# Patient Record
Sex: Male | Born: 1945 | Race: White | Hispanic: No | Marital: Married | Smoking: Never smoker
Health system: Southern US, Community
[De-identification: ages and names within clinical notes are randomized; demographics above are authoritative.]

## PROBLEM LIST (undated history)

## (undated) DIAGNOSIS — N419 Inflammatory disease of prostate, unspecified: Secondary | ICD-10-CM

## (undated) DIAGNOSIS — M1990 Unspecified osteoarthritis, unspecified site: Secondary | ICD-10-CM

## (undated) DIAGNOSIS — E041 Nontoxic single thyroid nodule: Secondary | ICD-10-CM

## (undated) DIAGNOSIS — J9801 Acute bronchospasm: Secondary | ICD-10-CM

## (undated) DIAGNOSIS — Z973 Presence of spectacles and contact lenses: Secondary | ICD-10-CM

## (undated) DIAGNOSIS — Z87442 Personal history of urinary calculi: Secondary | ICD-10-CM

## (undated) DIAGNOSIS — C801 Malignant (primary) neoplasm, unspecified: Secondary | ICD-10-CM

## (undated) DIAGNOSIS — R0600 Dyspnea, unspecified: Secondary | ICD-10-CM

## (undated) DIAGNOSIS — N400 Enlarged prostate without lower urinary tract symptoms: Secondary | ICD-10-CM

## (undated) DIAGNOSIS — K219 Gastro-esophageal reflux disease without esophagitis: Secondary | ICD-10-CM

## (undated) DIAGNOSIS — G500 Trigeminal neuralgia: Secondary | ICD-10-CM

## (undated) DIAGNOSIS — I10 Essential (primary) hypertension: Secondary | ICD-10-CM

## (undated) HISTORY — PX: APPENDECTOMY: SHX54

## (undated) HISTORY — PX: TONSILLECTOMY: SUR1361

## (undated) HISTORY — PX: COLONOSCOPY: SHX174

---

## 1996-10-18 HISTORY — PX: SHOULDER ARTHROSCOPY: SHX128

## 2012-05-18 HISTORY — PX: ABDOMINAL SURGERY: SHX537

## 2012-08-30 ENCOUNTER — Emergency Department (HOSPITAL_COMMUNITY)
Admission: EM | Admit: 2012-08-30 | Discharge: 2012-08-30 | Disposition: A | Discharge status: Home / Self Care | Payer: Medicare Other | Attending: Emergency Medicine | Admitting: Emergency Medicine

## 2012-08-30 ENCOUNTER — Emergency Department (HOSPITAL_COMMUNITY): Payer: Medicare Other

## 2012-08-30 ENCOUNTER — Encounter (HOSPITAL_COMMUNITY): Payer: Self-pay | Admitting: *Deleted

## 2012-08-30 DIAGNOSIS — J9801 Acute bronchospasm: Secondary | ICD-10-CM

## 2012-08-30 DIAGNOSIS — Z87448 Personal history of other diseases of urinary system: Secondary | ICD-10-CM | POA: Insufficient documentation

## 2012-08-30 DIAGNOSIS — I10 Essential (primary) hypertension: Secondary | ICD-10-CM | POA: Insufficient documentation

## 2012-08-30 DIAGNOSIS — Z8719 Personal history of other diseases of the digestive system: Secondary | ICD-10-CM | POA: Insufficient documentation

## 2012-08-30 HISTORY — DX: Inflammatory disease of prostate, unspecified: N41.9

## 2012-08-30 MED ORDER — ALBUTEROL SULFATE HFA 108 (90 BASE) MCG/ACT IN AERS
2.0000 | INHALATION_SPRAY | RESPIRATORY_TRACT | Status: DC | PRN
  Administered 2012-08-30: 2 via RESPIRATORY_TRACT
  Filled 2012-08-30: qty 6.7

## 2012-08-30 MED ORDER — ALBUTEROL SULFATE (5 MG/ML) 0.5% IN NEBU
5.0000 mg | INHALATION_SOLUTION | Freq: Once | RESPIRATORY_TRACT | Status: AC
  Administered 2012-08-30: 5 mg via RESPIRATORY_TRACT
  Filled 2012-08-30: qty 0.5

## 2012-08-30 NOTE — ED Notes (Signed)
Nasal congestion , cough, sob,Non productive cough,  Alert,

## 2012-08-30 NOTE — ED Notes (Signed)
Pt states relief from breathing treatment.

## 2012-08-30 NOTE — ED Provider Notes (Signed)
History   This chart was scribed for Lyanne Co, MD by Sofie Rower, ED Scribe. The patient was seen in room APA01/APA01 and the patient's care was started at 9:59PM.     CSN: [id number]  Arrival date & time 08/30/12  2145   First MD Initiated Contact with Patient 08/30/12 2159      Chief Complaint  Patient presents with  . Shortness of Breath    (Consider location/radiation/quality/duration/timing/severity/associated sxs/prior treatment) The history is provided by the patient and the spouse.    Mark Warren is a 66 y.o. male with a hx of 3 small intestine tumors and hypertension, who presents to the Emergency Department complaining of sudden, progressively worsening, shortness of breath, onset today (08/30/12).  Associated symptoms include non productive cough and nasal congestion. The pt's wife reports the pt began to experience a cough earlier this evening, after returning home from bible study, where his skin became pale. The pt has taken nasal spray which provides moderate relief of the nasal congestion. The pt has a hx of prostatitis and abdominal surgery.   The pt denies fever, hx of heart problems, and hx of blood blots within the lower extremities.   The pt does not smoke or drink alcohol.      Past Medical History  Diagnosis Date  . Prostatitis     Past Surgical History  Procedure Date  . Abdominal surgery     History reviewed. No pertinent family history.  History  Substance Use Topics  . Smoking status: Never Smoker   . Smokeless tobacco: Not on file  . Alcohol Use: No      Review of Systems  All other systems reviewed and are negative.    Allergies  Review of patient's allergies indicates no known allergies.  Home Medications  No current outpatient prescriptions on file.  BP 164/90  Pulse 63  Temp 98.8 F (37.1 C) (Oral)  Resp 20  Ht 5\' 10"  (1.778 m)  Wt 215 lb (97.523 kg)  BMI 30.85 kg/m2  SpO2 96%  Physical Exam  Nursing note  and vitals reviewed. Constitutional: He is oriented to person, place, and time. He appears well-developed and well-nourished.  HENT:  Head: Normocephalic and atraumatic.  Eyes: EOM are normal.  Neck: Normal range of motion.  Cardiovascular: Normal rate, regular rhythm, normal heart sounds and intact distal pulses.   Pulmonary/Chest: Effort normal and breath sounds normal. No respiratory distress.       Lungs are clear bilaterally.   Abdominal: Soft. He exhibits no distension. There is no tenderness.  Musculoskeletal: Normal range of motion.  Neurological: He is alert and oriented to person, place, and time.  Skin: Skin is warm and dry.  Psychiatric: He has a normal mood and affect. Judgment normal.    ED Course  Procedures (including critical care time)  DIAGNOSTIC STUDIES: Oxygen Saturation is 96% on room air, normal by my interpretation.    COORDINATION OF CARE:  10:08 PM- EKG results discussed.    10:11 PM- Treatment plan concerning application of albuterol breathing treatment and x-ray of chest discussed with patient. Pt agrees with treatment.      Dg Chest 2 View  08/30/2012  *RADIOLOGY REPORT*  Clinical Data: Shortness of breath, cough.  CHEST - 2 VIEW  Comparison: None  Findings: Elevation of the right hemidiaphragm. Heart and mediastinal contours are within normal limits.  No focal opacities or effusions.  No acute bony abnormality.  IMPRESSION: No active cardiopulmonary disease.  Original Report Authenticated By: Charlett Nose, M.D.    I personally reviewed the imaging tests through PACS system I reviewed available ER/hospitalization records through the EMR    1. Bronchospasm       MDM  The patient appears to have bronchospasm.  His lungs are now clear.  His vital signs are normal.  Pulse ox is 96% on room air.  Chest x-ray is clear.  The patient feels much better after his albuterol treatment.  Home with an albuterol inhaler.  He understands return to the ER  for new or worsening symptoms.  I doubt this is a pulmonary embolism.  This is not a presentation of ACS.  He has no risk factors for either pulmonary embolism or cardiac disease.      I personally performed the services described in this documentation, which was scribed in my presence. The recorded information has been reviewed and is accurate.      Lyanne Co, MD 08/30/12 [phone]

## 2012-08-30 NOTE — ED Notes (Signed)
resp contacted regarding breathing treatment.

## 2012-12-21 ENCOUNTER — Other Ambulatory Visit: Payer: Self-pay | Admitting: Orthopaedic Surgery

## 2012-12-22 ENCOUNTER — Encounter (HOSPITAL_BASED_OUTPATIENT_CLINIC_OR_DEPARTMENT_OTHER): Payer: Self-pay | Admitting: *Deleted

## 2012-12-22 NOTE — Progress Notes (Signed)
Pt had one episode coughand bronchospasm-11/13-has not had to use inhaler since-will bring-never smoked Will bring meds ans back up overnight bag since he lives far away-will plan to go home post op

## 2012-12-22 NOTE — H&P (Signed)
Mark Warren is an 67 y.o. male.   Chief Complaint: Right shoulder pain. 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  He has had a previous right shoulder arthroscopy many years ago but is now having pain over the last 3-4 months.  It started when he was throwing some trash away and is gotten much worse after his wife startled him in the house.  He is having pain with motion and difficulty sleeping.  X-rays of the shoulder show a 3A acromion and an MRI scan shows supraspinatus and infraspinatus tears with retraction of approximately 3 cm.  We have discussed with him proceeding with a right shoulder arthroscopy to reduce symptoms and hopefully increase function.  Past Medical History  Diagnosis Date  . Prostatitis     Past Surgical History  Procedure Laterality Date  . Abdominal surgery      No family history on file. Social History:  reports that he has never smoked. He does not have any smokeless tobacco history on file. He reports that he does not drink alcohol or use illicit drugs.  Allergies: No Known Allergies  No prescriptions prior to admission    No results found for this or any previous visit (from the past 48 hour(s)). No results found.  Review of Systems  Constitutional: Negative.   HENT: Negative.   Eyes: Negative.   Respiratory: Negative.   Cardiovascular: Negative.   Gastrointestinal: Negative.   Genitourinary: Negative.   Musculoskeletal: Positive for joint pain.  Skin: Negative.   Neurological: Negative.   Endo/Heme/Allergies: Negative.   Psychiatric/Behavioral: Negative.     There were no vitals taken for this visit. Physical Exam  Constitutional: He is oriented to person, place, and time. He appears well-nourished.  HENT:  Head: Atraumatic.  Eyes: Pupils are equal, round, and reactive to light.  Neck: Normal range of motion.  Cardiovascular: Normal rate and regular rhythm.   Respiratory: Breath sounds normal.  GI: Bowel  sounds are normal.  Musculoskeletal:  Right shoulder exam: Range of motion forward flexion 150, external rotation 45 and internal rotation to the sacrum.  He does have positive impingement in both positions.  He does have a painful arc when raising his arm up and down but a negative drop arm test.  Rotator cuff strength 4 minus over 5.  No pain at the a.c. Joint.  Full cervical motion and pain-free.  Neurological: He is oriented to person, place, and time.  Skin: Skin is dry.  Psychiatric: He has a normal mood and affect.     Assessment/Plan Assessment: Right shoulder rotator cuff tear with history of arthroscopy years ago. Plan: [facility] as increasing right shoulder pain causing him trouble sleeping at nighttime.  He is undergone physical therapy which has not been helpful.  We have reviewed the risk of anesthesia infection associated with arthroscopy of the shoulder.  The hope is that we can repair the rotator cuff tear and if this is not possible we would debride it.  Postoperatively he most likely will need physical therapy as well.  CARNAGHI,MICHAEL R 12/22/2012, 8:50 AM

## 2012-12-26 ENCOUNTER — Ambulatory Visit (HOSPITAL_BASED_OUTPATIENT_CLINIC_OR_DEPARTMENT_OTHER): Admission: RE | Admit: 2012-12-26 | Payer: Medicare Other | Source: Ambulatory Visit | Admitting: Orthopaedic Surgery

## 2012-12-26 HISTORY — DX: Essential (primary) hypertension: I10

## 2012-12-26 HISTORY — DX: Gastro-esophageal reflux disease without esophagitis: K21.9

## 2012-12-26 HISTORY — DX: Trigeminal neuralgia: G50.0

## 2012-12-26 HISTORY — DX: Malignant (primary) neoplasm, unspecified: C80.1

## 2012-12-26 HISTORY — DX: Acute bronchospasm: J98.01

## 2012-12-26 HISTORY — DX: Benign prostatic hyperplasia without lower urinary tract symptoms: N40.0

## 2012-12-26 HISTORY — DX: Unspecified osteoarthritis, unspecified site: M19.90

## 2012-12-26 HISTORY — DX: Presence of spectacles and contact lenses: Z97.3

## 2012-12-26 SURGERY — ARTHROSCOPY, SHOULDER, WITH ROTATOR CUFF REPAIR
Anesthesia: General | Site: Shoulder | Laterality: Right

## 2013-04-18 DIAGNOSIS — M25519 Pain in unspecified shoulder: Secondary | ICD-10-CM

## 2013-10-27 IMAGING — CR DG CHEST 2V
2 series · 2 of 2 positions shown · non-contrast
Comparison: None

CLINICAL DATA: Shortness of breath, cough.

CHEST - 2 VIEW

[view not recorded (1 of 2)]
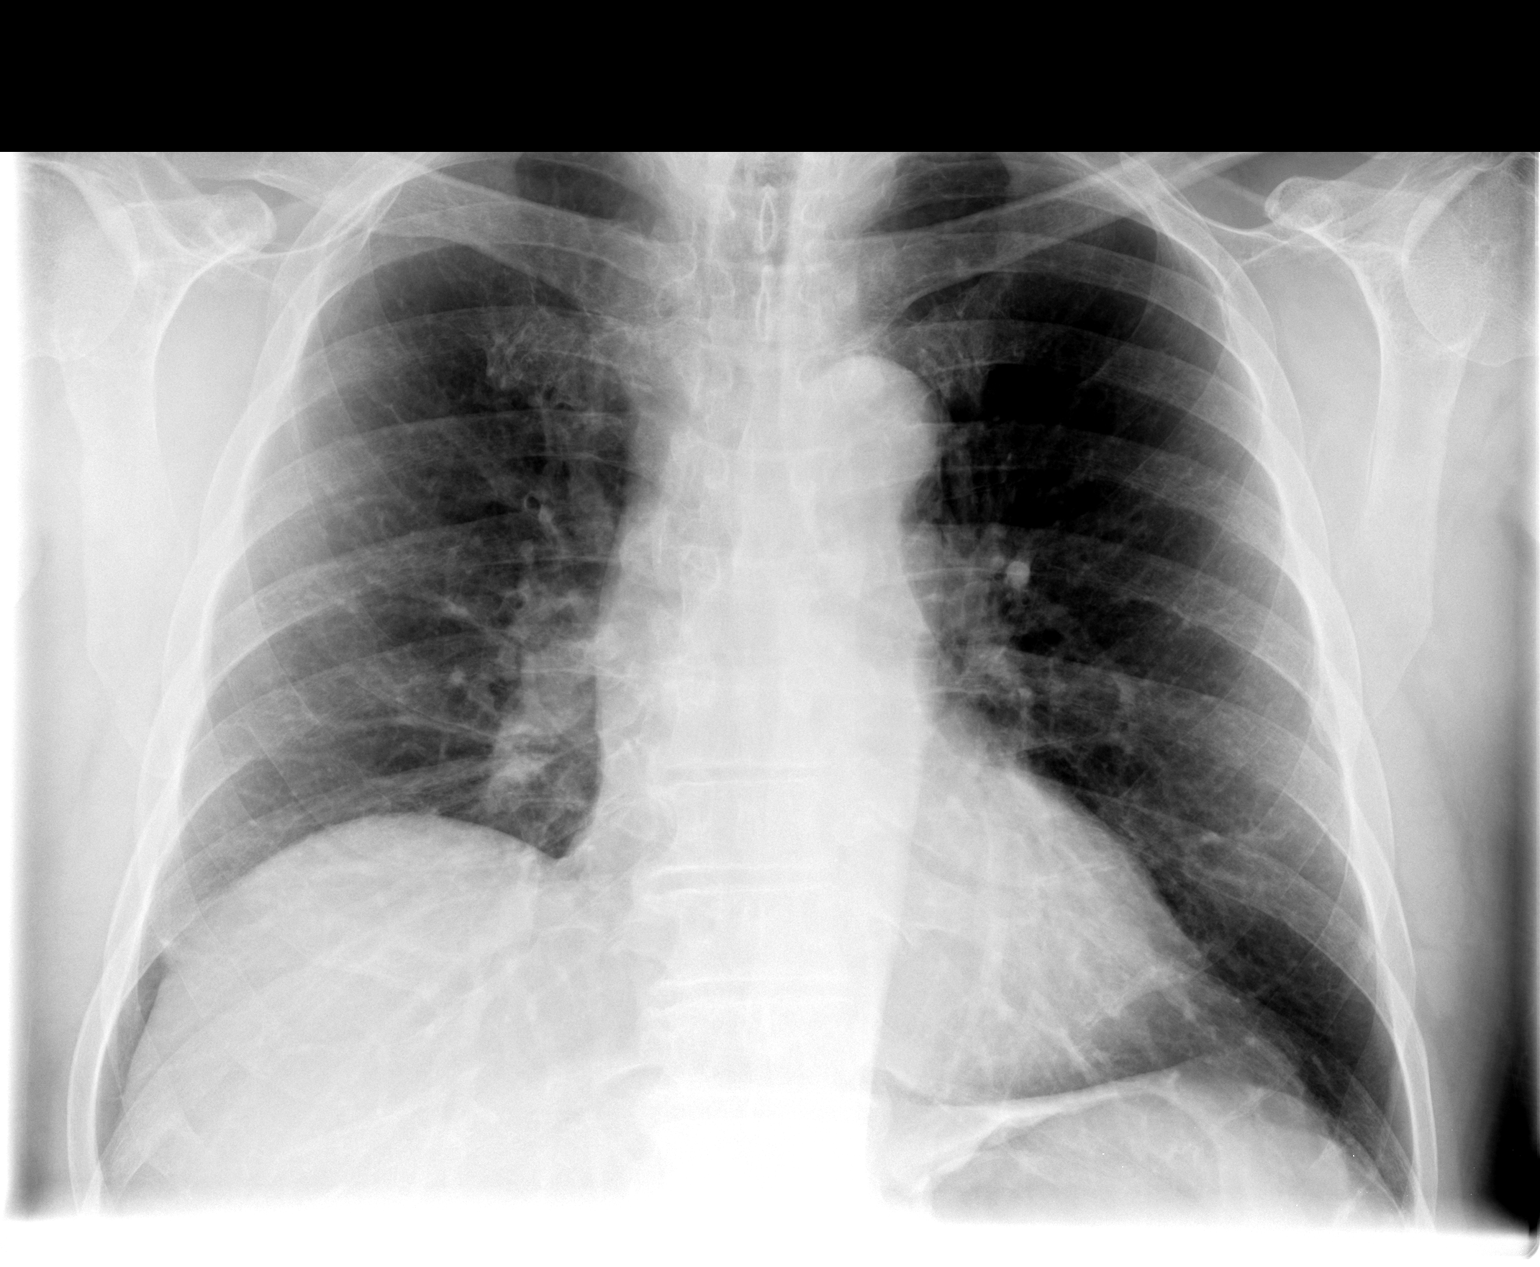

[view not recorded (2 of 2)]
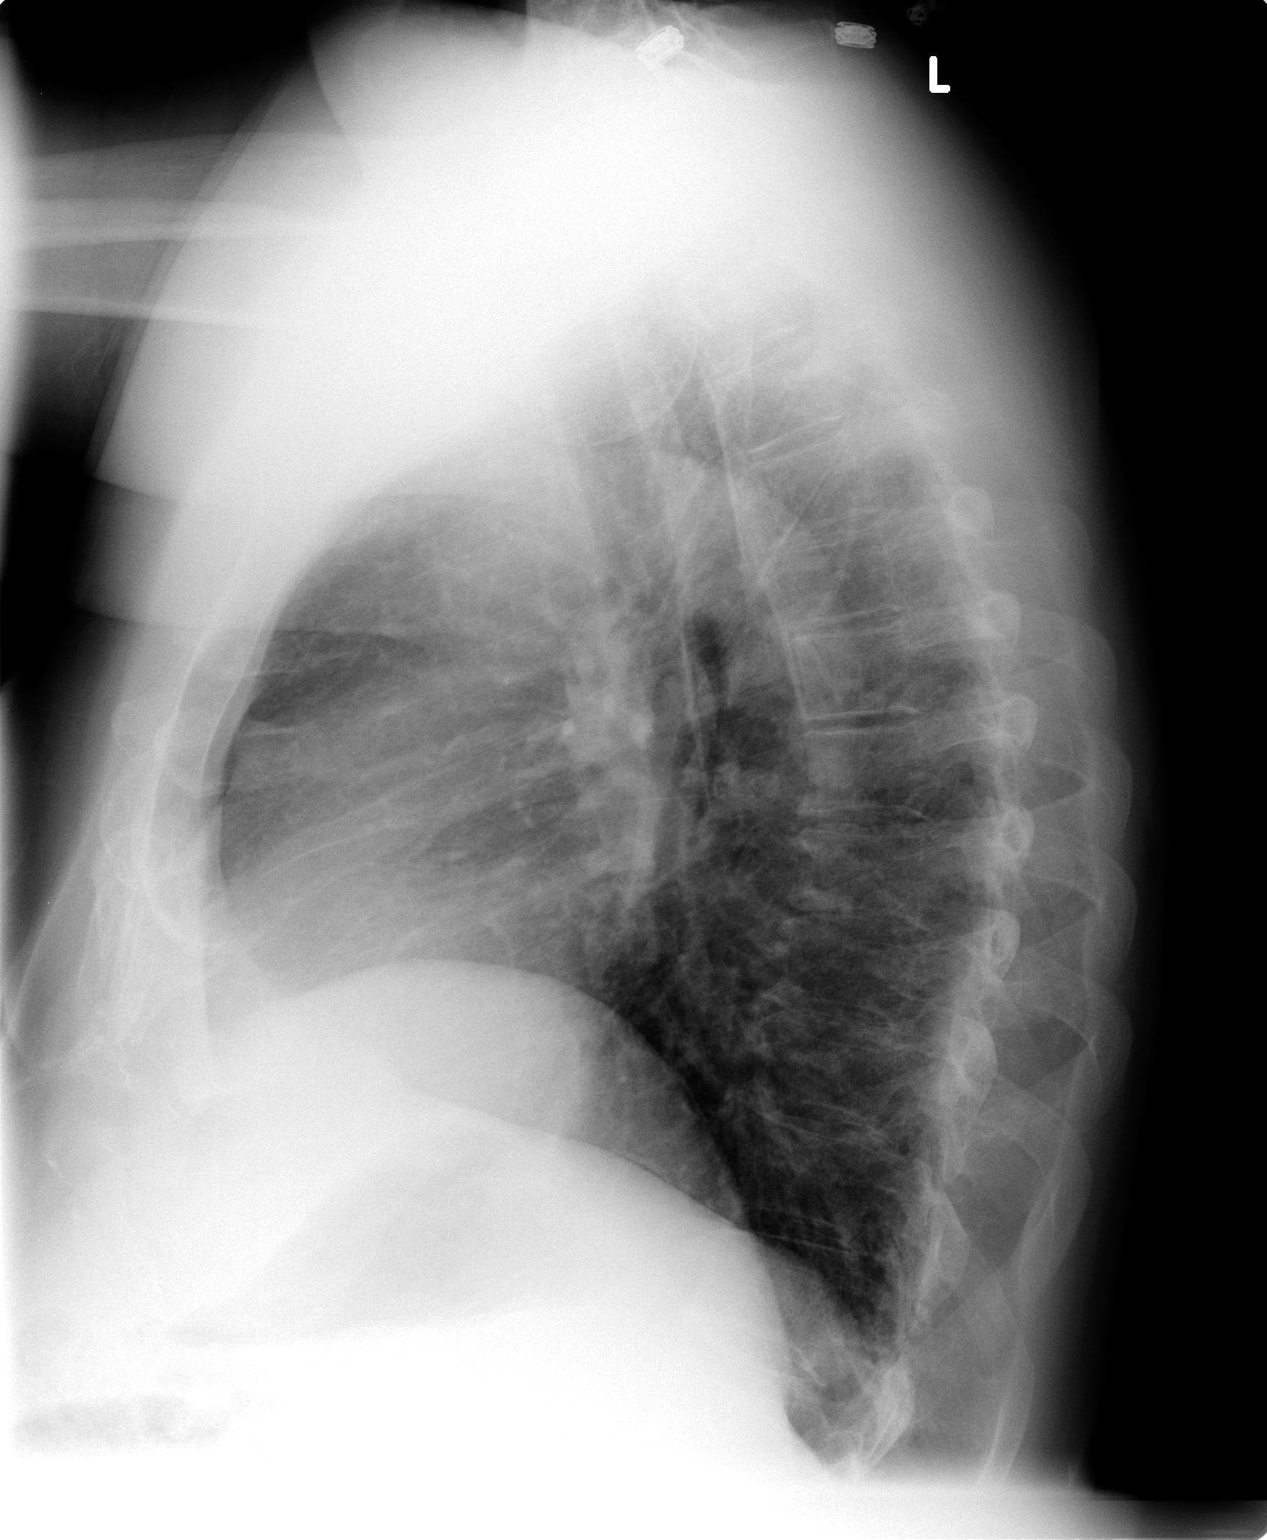

[2 of 2 positions shown; findings below may reference images not displayed]

FINDINGS: Elevation of the right hemidiaphragm. Heart and
mediastinal contours are within normal limits.  No focal opacities
or effusions.  No acute bony abnormality.
IMPRESSION: No active cardiopulmonary disease.

## 2018-03-23 ENCOUNTER — Other Ambulatory Visit: Payer: Self-pay | Admitting: Neurosurgery

## 2018-05-15 NOTE — Pre-Procedure Instructions (Signed)
Mark Warren.  05/15/2018      [place], [place] [address] Phone: [phone] Fax: [phone]    Your procedure is scheduled on Thursday August 8.  Report to [facility] Admitting at 5:30 A.M.  Call this number if you have problems the morning of surgery:  [phone]   Remember:  Do not eat or drink after midnight.    Take these medicines the morning of surgery with A SIP OF WATER:   Doxazosin (Cardura) Finasteride (proscar) Pantoprazole (protonix) if needed Pregabalin (Lyrica)  7 days prior to surgery STOP taking any Aspirin(unless otherwise instructed by your surgeon), Aleve, Naproxen, Ibuprofen, Motrin, Advil, Goody's, BC's, all herbal medications, fish oil, and all vitamins     Do not wear jewelry  Do not wear lotions, powders, or colognes, or deodorant.  Do not shave 48 hours prior to surgery.  Men may shave face and neck.  Do not bring valuables to the hospital.  [facility] is not responsible for any belongings or valuables.  Contacts, dentures or bridgework may not be worn into surgery.  Leave your suitcase in the car.  After surgery it may be brought to your room.  For patients admitted to the hospital, discharge time will be determined by your treatment team.  Patients discharged the day of surgery will not be allowed to drive home.   Special instructions:    [facility]- Preparing For Surgery  Before surgery, you can play an important role. Because skin is not sterile, your skin needs to be as free of germs as possible. You can reduce the number of germs on your skin by washing with CHG (chlorahexidine gluconate) Soap before surgery.  CHG is an antiseptic cleaner which kills germs and bonds with the skin to continue killing germs even after washing.    Oral Hygiene is also important to reduce your risk of infection.  Remember - BRUSH YOUR TEETH THE MORNING OF SURGERY  WITH YOUR REGULAR TOOTHPASTE  Please do not use if you have an allergy to CHG or antibacterial soaps. If your skin becomes reddened/irritated stop using the CHG.  Do not shave (including legs and underarms) for at least 48 hours prior to first CHG shower. It is OK to shave your face.  Please follow these instructions carefully.   1. Shower the NIGHT BEFORE SURGERY and the MORNING OF SURGERY with CHG.   2. If you chose to wash your hair, wash your hair first as usual with your normal shampoo.  3. After you shampoo, rinse your hair and body thoroughly to remove the shampoo.  4. Use CHG as you would any other liquid soap. You can apply CHG directly to the skin and wash gently with a scrungie or a clean washcloth.   5. Apply the CHG Soap to your body ONLY FROM THE NECK DOWN.  Do not use on open wounds or open sores. Avoid contact with your eyes, ears, mouth and genitals (private parts). Wash Face and genitals (private parts)  with your normal soap.  6. Wash thoroughly, paying special attention to the area where your surgery will be performed.  7. Thoroughly rinse your body with warm water from the neck down.  8. DO NOT shower/wash with your normal soap after using and rinsing off the CHG Soap.  9. Pat yourself dry with a CLEAN TOWEL.  10. Wear CLEAN PAJAMAS to bed the night  before surgery, wear comfortable clothes the morning of surgery  11. Place CLEAN SHEETS on your bed the night of your first shower and DO NOT SLEEP WITH PETS.    Day of Surgery:  Do not apply any deodorants/lotions.  Please wear clean clothes to the hospital/surgery center.   Remember to brush your teeth WITH YOUR REGULAR TOOTHPASTE.    Please read over the following fact sheets that you were given. Coughing and Deep Breathing, MRSA Information and Surgical Site Infection Prevention

## 2018-05-16 ENCOUNTER — Encounter (HOSPITAL_COMMUNITY)
Admission: RE | Admit: 2018-05-16 | Discharge: 2018-05-16 | Disposition: A | Discharge status: Home / Self Care | Payer: Medicare Other | Source: Ambulatory Visit | Attending: Neurosurgery | Admitting: Neurosurgery

## 2018-05-16 ENCOUNTER — Other Ambulatory Visit: Payer: Self-pay

## 2018-05-16 ENCOUNTER — Encounter (HOSPITAL_COMMUNITY): Payer: Self-pay

## 2018-05-16 DIAGNOSIS — Z01818 Encounter for other preprocedural examination: Secondary | ICD-10-CM | POA: Diagnosis not present

## 2018-05-16 DIAGNOSIS — I10 Essential (primary) hypertension: Secondary | ICD-10-CM | POA: Diagnosis not present

## 2018-05-16 HISTORY — DX: Nontoxic single thyroid nodule: E04.1

## 2018-05-16 HISTORY — DX: Personal history of urinary calculi: Z87.442

## 2018-05-16 HISTORY — DX: Dyspnea, unspecified: R06.00

## 2018-05-16 LAB — BASIC METABOLIC PANEL
Anion gap: 9 (ref 5–15)
BUN: 17 mg/dL (ref 8–23)
CO2: 22 mmol/L (ref 22–32)
CREATININE: 1.14 mg/dL (ref 0.61–1.24)
Calcium: 9.4 mg/dL (ref 8.9–10.3)
Chloride: 108 mmol/L (ref 98–111)
Glucose, Bld: 97 mg/dL (ref 70–99)
Potassium: 4.3 mmol/L (ref 3.5–5.1)
SODIUM: 139 mmol/L (ref 135–145)

## 2018-05-16 LAB — CBC
HCT: 45.3 % (ref 39.0–52.0)
Hemoglobin: 14.8 g/dL (ref 13.0–17.0)
MCH: 27.9 pg (ref 26.0–34.0)
MCHC: 32.7 g/dL (ref 30.0–36.0)
MCV: 85.5 fL (ref 78.0–100.0)
PLATELETS: 173 10*3/uL (ref 150–400)
RBC: 5.3 MIL/uL (ref 4.22–5.81)
RDW: 13.7 % (ref 11.5–15.5)
WBC: 8.5 10*3/uL (ref 4.0–10.5)

## 2018-05-16 LAB — SURGICAL PCR SCREEN
MRSA, PCR: NEGATIVE
Staphylococcus aureus: NEGATIVE

## 2018-05-16 LAB — ABO/RH: ABO/RH(D): O NEG

## 2018-05-16 LAB — TYPE AND SCREEN
ABO/RH(D): O NEG
ANTIBODY SCREEN: NEGATIVE

## 2018-05-16 NOTE — H&P (Signed)
Patient ID:   [id number] Patient: Mark Warren  Date of Birth: [birth year] Visit Type: Office Visit   Date: 03/22/2018 01:00 PM Provider: Marchia Meiers. Vertell Limber MD   This 72 year old male presents for back pain.  HISTORY OF PRESENT ILLNESS:  1.  back pain  Mark Warren, 72 year old retired male, visits for evaluation of low back and left leg pain.  He recalls no injury noting symptoms have increased over the past 6 months.   Lyrica 150 mg daily Mobic 15 mg daily "for knee" is helpful  ESI x3 offered no relief  History:  Colon cancer, HTN, GERD Surgical history:  Colon 2013, right rotator cuff 2014, tonsillectomy and appendectomy years ago  MRI uploaded [place].  X-rays on [place]  The patient is complaining of low back pain to the left shin with burning in the medial thigh.  He says he cannot do anything and cannot walk that sitting is okay.  He has midline low back pain.           PAST MEDICAL/SURGICAL HISTORY   (Detailed)  Disease/disorder Onset Date Management Date Comments  Cancer, colon 2013       colon surgery      right rotator cuff surgery      Appendectomy      Tonsillectomy    Gerd      Hypertension         PAST MEDICAL HISTORY, SURGICAL HISTORY, FAMILY HISTORY, SOCIAL HISTORY AND REVIEW OF SYSTEMS I have reviewed the patient's past medical, surgical, family and social history as well as the comprehensive review of systems as included on the [facility] history form dated 03/22/2018, which I have signed.  Family History:  (Detailed) Relationship Family Member Name Deceased Age at Death Condition Onset Age Cause of Death      Family history of Cancer, unknown  N     Social History:  (Detailed) Tobacco use reviewed. Preferred language is [place].   Tobacco use status: Current non-smoker. Smoking status: Never smoker.  SMOKING STATUS Type Smoking Status Usage Per Day Years Used Total Pack Years   Never smoker           MEDICATIONS: (added, continued or stopped this visit) Started Medication Directions Instruction Stopped   doxazosin 4 mg tablet take 1 tablet by oral route  every day     finasteride 5 mg tablet take 1 tablet by oral route  every day     Lyrica 150 mg capsule take 1 capsule by oral route 2 times every day     meloxicam 15 mg tablet take 1 tablet by oral route  every day     pantoprazole 40 mg tablet,delayed release take 1 tablet by oral route  every day     ramipril 2.5 mg capsule take 1 capsule by oral route  every day       ALLERGIES: Ingredient Reaction Medication Name Comment  NO KNOWN ALLERGIES     No known allergies. Reviewed, no changes.   REVIEW OF SYSTEMS   See scanned patient registration form, dated 03/22/2018, signed and dated on 03/23/2018  Review of Systems Details System Neg/Pos Details  Constitutional Negative Chills, Fatigue, Fever, Malaise, Night sweats, Weight gain and Weight loss.  ENMT Negative Ear drainage, Hearing loss, Nasal drainage, Otalgia, Sinus pressure and Sore throat.  Eyes Negative Eye discharge, Eye pain and Vision changes.  Respiratory Negative Chronic cough, Cough, Dyspnea, Known TB exposure and Wheezing.  Cardio Negative Chest pain,  Claudication, Edema and Irregular heartbeat/palpitations.  GI Negative Abdominal pain, Blood in stool, Change in stool pattern, Constipation, Decreased appetite, Diarrhea, Heartburn, Nausea and Vomiting.  GU Negative Dribbling, Dysuria, Erectile dysfunction, Hematuria, Polyuria (Genitourinary), Slow stream, Urinary frequency, Urinary incontinence and Urinary retention.  Endocrine Negative Cold intolerance, Heat intolerance, Polydipsia and Polyphagia.  Neuro Positive Gait disturbance.  Psych Negative Anxiety, Depression and Insomnia.  Integumentary Negative Brittle hair, Brittle nails, Change in shape/size of mole(s), Hair loss, Hirsutism, Hives, Pruritus, Rash and Skin lesion.  MS Positive Back pain.   Hema/Lymph Negative Easy bleeding, Easy bruising and Lymphadenopathy.  Allergic/Immuno Negative Contact allergy, Environmental allergies, Food allergies and Seasonal allergies.  Reproductive Negative Penile discharge and Sexual dysfunction.   PHYSICAL EXAM:   Vitals Date Temp F BP Pulse Ht In Wt Lb BMI BSA Pain Score  03/22/2018  144/77 67 69 219 32.34  5/10    PHYSICAL EXAM Details General Level of Distress: no acute distress Overall Appearance: obese  Head and Face  Right Left  Fundoscopic Exam:  normal normal    Cardiovascular Cardiac: regular rate and rhythm without murmur  Right Left  Carotid Pulses: normal normal  Respiratory Lungs: clear to auscultation  Neurological Orientation: normal Recent and Remote Memory: normal Attention Span and Concentration:   normal Language: normal Fund of Knowledge: normal  Right Left Sensation: normal normal Upper Extremity Coordination: normal normal  Lower Extremity Coordination: normal normal  Musculoskeletal Gait and Station: normal  Right Left Upper Extremity Muscle Strength: normal normal Lower Extremity Muscle Strength: normal normal Upper Extremity Muscle Tone:  normal normal Lower Extremity Muscle Tone: normal normal   Motor Strength Upper and lower extremity motor strength was tested in the clinically pertinent muscles.     Deep Tendon Reflexes  Right Left Biceps: normal normal Triceps: normal normal Brachioradialis: normal normal Patellar: normal normal Achilles: normal normal  Sensory Sensation was tested at L1 to S1.   Cranial Nerves II. Optic Nerve/Visual Fields: normal III. Oculomotor: normal IV. Trochlear: normal V. Trigeminal: normal VI. Abducens: normal VII. Facial: normal VIII. Acoustic/Vestibular: normal IX. Glossopharyngeal: normal X. Vagus: normal XI. Spinal Accessory: normal XII. Hypoglossal: normal  Motor and other Tests Lhermittes: negative Rhomberg: negative Pronator  drift: absent     Right Left Hoffman's: normal normal Clonus: normal normal Babinski: normal normal SLR: negative negative Patrick's Corky Sox): negative negative Toe Walk: normal normal Toe Lift: normal normal Heel Walk: normal normal SI Joint: nontender nontender   Additional Findings:  The patient is unable to stand for any length of time whatsoever.  While he is seated he is actually not having significant discomfort.   DIAGNOSTIC RESULTS:   The lumbar MRI demonstrates grade 1 anterolisthesis most marked at L3-4 and L4-5 levels with spinal stenosis and foraminal stenosis at L2-3, L3-4, L4-5 levels.  There is severe disc degeneration and disc space collapse with foraminal stenosis at each of these levels.    IMPRESSION:   The patient has severe spinal stenosis and foraminal stenosis with grade 1 anterolisthesis.  He has complete relief with sitting but is not able to stand for any length of time.  He is obese.  I have recommended that he undergo decompression and fusion L2-3 L3-4 L4-5 levels with XL IF from right-sided approach with percutaneous pedicle screw fixation as I think this will help relieve his symptoms due to foraminal stenosis and nerve compression with anterolisthesis at these 3 affected levels.  PLAN:  Right-sided XL IF L2-3, L3-4, L4-5 levels.  This  has been scheduled for August 8th at [facility].  Prescription for LSO brace was given.  Risks and benefits were discussed in detail with the patient and nursing education was performed.  Orders: Diagnostic Procedures: Assessment Procedure  M41.20 Scoliosis- AP/Lat  M54.16 Lumbar Spine- AP/Lat/Flex/Ex  Instruction(s)/Education: Assessment Instruction  I10 Hypertension education  [phone] Dietary management education, guidance, and counseling   Completed Orders (this encounter) Order Details Reason Side Interpretation Result Initial Treatment Date Region  Lumbar Spine- AP/Lat/Flex/Ex      03/22/2018 All Levels  to All Levels  Scoliosis- AP/Lat      03/22/2018 All Levels to All Levels  Hypertension education Patient to follow up with primary care provider.        Dietary management education, guidance, and counseling patient encouraged to eat a well balanced diet         Assessment/Plan   # Detail Type Description   1. 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       2. Assessment Low back pain, unspecified back pain laterality, with sciatica presence unspecified (M54.5).       3. Assessment Lumbar radiculopathy (M54.16).       4. Assessment Scoliosis (and kyphoscoliosis), idiopathic (M41.20).       5. 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       6. Assessment Essential (primary) hypertension (I10).       7. Assessment Body mass index (BMI) 32.0-32.9, adult (B01.75).   Plan Orders Today's instructions / counseling include(s) Dietary management education, guidance, and counseling.         Pain Management Plan Pain Scale: 5/10. Method: Numeric Pain Intensity Scale. Location: back. Onset: 03/22/2017. Duration: varies. Quality: discomforting. Pain management follow-up plan of care: Patient is taking OTC pain relievers for relief..              Provider:  Vertell Limber MD, Marchia Meiers 04/05/2018 8:31 AM  Dictation edited by: Marchia Meiers. Vertell Limber    CC Providers: Moshe Cipro [address] [place]  [id number]-   James Dailey  [address] [place] [address]-              Electronically signed by Marchia Meiers. Vertell Limber MD on 04/05/2018 08:31 AM

## 2018-05-16 NOTE — Progress Notes (Signed)
PCP - Moshe Cipro Cardiologist - [place]  Chest x-ray - not needed EKG - 05/16/18 Stress Test - requesting ECHO - requesting Cardiac Cath - denies  Anesthesia review: yes for records requested  Patient denies shortness of breath, fever, cough and chest pain at PAT appointment   Patient verbalized understanding of instructions that were given to them at the PAT appointment. Patient was also instructed that they will need to review over the PAT instructions again at home before surgery.

## 2018-05-18 NOTE — Progress Notes (Signed)
Anesthesia Chart Review:  Case:  [id number] Date/Time:  05/25/18 0715   Procedures:      Right Lumbar 2-3 Lumbar 3-4 Lumbar 4-5 Anterolateral lumbar interbody decompression/fusion with percutaneous pedicle screw fixation (Right ) - Right Lumbar 2-3 Lumbar 3-4 Lumbar 4-5 Anterolateral lumbar interbody decompression/fusion with percutaneous pedicle screw fixation     LUMBAR PERCUTANEOUS PEDICLE SCREW 3 LEVEL (N/A )   Anesthesia type:  General   Pre-op diagnosis:  Lumbar stenosis with neurogenic claudication   Location:  [facility] OR ROOM 21 / [facility]   Surgeon:  Erline Levine, MD      DISCUSSION: 71 yo male never smoker for above procedure. Pertinent hx includes HTN, Trigeminal neuralgia, GERD, Bronchospasm, DOE.  In 2018 pt had cardiovascular eval for DOE at [facility] by Dr. Raechel Chute, Review of records indicates the pt underwent TTE and Stress MPI which were unremarkable. In his note dated 09/30/2017 he reported the pt's symptom of exertional dyspnea had significantly improved and he recommended 1 year followup.  Anticipate he can proceed with surgery as scheduled barring acute status change.   VS: BP 132/81   Pulse 73   Temp 36.8 C   Resp 20   Ht 5\' 10"  (1.778 m)   Wt 217 lb 6.4 oz (98.6 kg)   SpO2 95%   BMI 31.19 kg/m   PROVIDERS: Moshe Cipro, MD is PCP  Raechel Chute, MD is Cardiologist last seen 09/30/2017   LABS: Labs reviewed: Acceptable for surgery. (all labs ordered are listed, but only abnormal results are displayed)  Labs Reviewed  SURGICAL PCR SCREEN  BASIC METABOLIC PANEL  CBC  TYPE AND SCREEN  ABO/RH     IMAGES: CHEST - 2 VIEW 08/30/2012  Comparison: None  Findings: Elevation of the right hemidiaphragm. Heart and mediastinal contours are within normal limits.  No focal opacities or effusions.  No acute bony abnormality.  IMPRESSION: No active cardiopulmonary disease.  EKG: 05/16/2018: Normal sinus rhythm  CV: Myocardial SPECT  09/19/2017 (outside record, copy in pt chart): Findings:   1.  Perfusion study: Perfusion scan shows a mild fixed septal defect.  It is more pronounced at rest compared to stress  2.  Gated study normal segmental wall motion with ejection fraction of 63%  Impression:  1.  No ischemia or abnormal flow reserve with vasodilator stress  2.  Likely soft tissue attenuation  Echo 08/31/2017 (outside record, copy in pt chart): Overall unremarkable.  EF over 60%, grade 1 LV filling, likely normal LVFP, normal LA size, normal RVSP and normal RV function.  Normal valves   Past Medical History:  Diagnosis Date  . Arthritis   . BPH (benign prostatic hypertrophy)   . Bronchospasm    hx-er x1   . Cancer ([place])    hx colon cancer-no chemo or rad  . Dyspnea    hx  . GERD (gastroesophageal reflux disease)    occ  . History of kidney stones   . Hypertension   . Prostatitis   . Thyroid nodule   . Trigeminal neuralgia   . Wears glasses     Past Surgical History:  Procedure Laterality Date  . ABDOMINAL SURGERY  8/13   colon resection for cancer [facility]  . APPENDECTOMY    . COLONOSCOPY    . SHOULDER ARTHROSCOPY  1998   right  . TONSILLECTOMY      MEDICATIONS: . doxazosin (CARDURA) 4 MG tablet  . finasteride (PROSCAR) 5 MG tablet  . Multiple  Vitamin (MULTIVITAMIN WITH MINERALS) TABS  . pantoprazole (PROTONIX) 40 MG tablet  . pregabalin (LYRICA) 150 MG capsule  . ramipril (ALTACE) 2.5 MG capsule   No current facility-administered medications for this encounter.      Wynonia Musty [facility] Short Stay Center/Anesthesiology Phone [phone] 05/18/2018 2:21 PM

## 2018-05-24 NOTE — Anesthesia Preprocedure Evaluation (Addendum)
Anesthesia Evaluation  Patient identified by MRN, date of birth, ID band Patient awake    Reviewed: Allergy & Precautions, NPO status , Patient's Chart, lab work & pertinent test results  History of Anesthesia Complications Negative for: history of anesthetic complications  Airway Mallampati: II  TM Distance: >3 FB Neck ROM: Full    Dental no notable dental hx. (+) Dental Advisory Given   Pulmonary neg pulmonary ROS,    Pulmonary exam normal        Cardiovascular hypertension, Pt. on medications Normal cardiovascular exam     Neuro/Psych negative psych ROS   GI/Hepatic Neg liver ROS, GERD  ,  Endo/Other  negative endocrine ROS  Renal/GU negative Renal ROS     Musculoskeletal negative musculoskeletal ROS (+)   Abdominal   Peds  Hematology negative hematology ROS (+)   Anesthesia Other Findings Day of surgery medications reviewed with the patient.  Reproductive/Obstetrics                            Anesthesia Physical Anesthesia Plan  ASA: III  Anesthesia Plan: General   Post-op Pain Management:    Induction: Intravenous  PONV Risk Score and Plan: 3 and Ondansetron, Dexamethasone and Scopolamine patch - Pre-op  Airway Management Planned: Oral ETT  Additional Equipment:   Intra-op Plan:   Post-operative Plan: Extubation in OR  Informed Consent: I have reviewed the patients History and Physical, chart, labs and discussed the procedure including the risks, benefits and alternatives for the proposed anesthesia with the patient or authorized representative who has indicated his/her understanding and acceptance.   Dental advisory given  Plan Discussed with: CRNA and Anesthesiologist  Anesthesia Plan Comments:        Anesthesia Quick Evaluation

## 2018-05-25 ENCOUNTER — Inpatient Hospital Stay (HOSPITAL_COMMUNITY)
Admission: RE | Admit: 2018-05-25 | Discharge: 2018-05-29 | DRG: 460 | Disposition: A | Discharge status: Skilled Nursing Facility | Payer: Medicare Other | Source: Ambulatory Visit | Attending: Neurosurgery | Admitting: Neurosurgery

## 2018-05-25 ENCOUNTER — Inpatient Hospital Stay (HOSPITAL_COMMUNITY): Payer: Medicare Other | Admitting: Anesthesiology

## 2018-05-25 ENCOUNTER — Inpatient Hospital Stay (HOSPITAL_COMMUNITY): Payer: Medicare Other

## 2018-05-25 ENCOUNTER — Encounter (HOSPITAL_COMMUNITY): Payer: Self-pay

## 2018-05-25 ENCOUNTER — Other Ambulatory Visit: Payer: Self-pay

## 2018-05-25 ENCOUNTER — Inpatient Hospital Stay (HOSPITAL_COMMUNITY): Payer: Medicare Other | Admitting: Physician Assistant

## 2018-05-25 ENCOUNTER — Encounter (HOSPITAL_COMMUNITY)
Admission: RE | Disposition: A | Discharge status: Skilled Nursing Facility | Payer: Self-pay | Source: Ambulatory Visit | Attending: Neurosurgery

## 2018-05-25 DIAGNOSIS — Z791 Long term (current) use of non-steroidal anti-inflammatories (NSAID): Secondary | ICD-10-CM

## 2018-05-25 DIAGNOSIS — M5416 Radiculopathy, lumbar region: Secondary | ICD-10-CM | POA: Diagnosis present

## 2018-05-25 DIAGNOSIS — I10 Essential (primary) hypertension: Secondary | ICD-10-CM | POA: Diagnosis present

## 2018-05-25 DIAGNOSIS — Z9089 Acquired absence of other organs: Secondary | ICD-10-CM | POA: Diagnosis not present

## 2018-05-25 DIAGNOSIS — M48062 Spinal stenosis, lumbar region with neurogenic claudication: Secondary | ICD-10-CM | POA: Diagnosis present

## 2018-05-25 DIAGNOSIS — Z6832 Body mass index (BMI) 32.0-32.9, adult: Secondary | ICD-10-CM

## 2018-05-25 DIAGNOSIS — Z885 Allergy status to narcotic agent status: Secondary | ICD-10-CM

## 2018-05-25 DIAGNOSIS — Z85038 Personal history of other malignant neoplasm of large intestine: Secondary | ICD-10-CM | POA: Diagnosis not present

## 2018-05-25 DIAGNOSIS — M4316 Spondylolisthesis, lumbar region: Secondary | ICD-10-CM | POA: Diagnosis present

## 2018-05-25 DIAGNOSIS — K219 Gastro-esophageal reflux disease without esophagitis: Secondary | ICD-10-CM | POA: Diagnosis present

## 2018-05-25 DIAGNOSIS — M4120 Other idiopathic scoliosis, site unspecified: Secondary | ICD-10-CM | POA: Diagnosis present

## 2018-05-25 DIAGNOSIS — Z419 Encounter for procedure for purposes other than remedying health state, unspecified: Secondary | ICD-10-CM

## 2018-05-25 DIAGNOSIS — Z9049 Acquired absence of other specified parts of digestive tract: Secondary | ICD-10-CM | POA: Diagnosis not present

## 2018-05-25 DIAGNOSIS — E669 Obesity, unspecified: Secondary | ICD-10-CM | POA: Diagnosis present

## 2018-05-25 HISTORY — PX: ANTERIOR LAT LUMBAR FUSION: SHX1168

## 2018-05-25 HISTORY — PX: LUMBAR PERCUTANEOUS PEDICLE SCREW 3 LEVEL: SHX5562

## 2018-05-25 SURGERY — ANTERIOR LATERAL LUMBAR FUSION 3 LEVELS
Anesthesia: General | Laterality: Right

## 2018-05-25 MED ORDER — LIDOCAINE-EPINEPHRINE 1 %-1:100000 IJ SOLN
INTRAMUSCULAR | Status: AC
  Filled 2018-05-25: qty 1

## 2018-05-25 MED ORDER — DEXAMETHASONE SODIUM PHOSPHATE 10 MG/ML IJ SOLN
INTRAMUSCULAR | Status: AC
  Filled 2018-05-25: qty 2

## 2018-05-25 MED ORDER — FENTANYL CITRATE (PF) 250 MCG/5ML IJ SOLN
INTRAMUSCULAR | Status: AC
  Filled 2018-05-25: qty 5

## 2018-05-25 MED ORDER — ROCURONIUM BROMIDE 10 MG/ML (PF) SYRINGE
PREFILLED_SYRINGE | INTRAVENOUS | Status: AC
  Filled 2018-05-25: qty 10

## 2018-05-25 MED ORDER — MENTHOL 3 MG MT LOZG
1.0000 | LOZENGE | OROMUCOSAL | Status: DC | PRN
  Administered 2018-05-26: 3 mg via ORAL
  Filled 2018-05-25: qty 9

## 2018-05-25 MED ORDER — FLEET ENEMA 7-19 GM/118ML RE ENEM: 1.0000 | ENEMA | Freq: Once | RECTAL | Status: DC | PRN

## 2018-05-25 MED ORDER — BUPIVACAINE HCL (PF) 0.5 % IJ SOLN
INTRAMUSCULAR | Status: DC | PRN
  Administered 2018-05-25: 20 mL
  Administered 2018-05-25: 10 mL

## 2018-05-25 MED ORDER — HYDROCODONE-ACETAMINOPHEN 10-325 MG PO TABS
2.0000 | ORAL_TABLET | ORAL | Status: DC | PRN
  Administered 2018-05-26 – 2018-05-29 (×3): 2 via ORAL
  Filled 2018-05-25 (×4): qty 2

## 2018-05-25 MED ORDER — CEFAZOLIN SODIUM 1 G IJ SOLR
INTRAMUSCULAR | Status: AC
  Filled 2018-05-25: qty 20

## 2018-05-25 MED ORDER — CEFAZOLIN SODIUM-DEXTROSE 2-4 GM/100ML-% IV SOLN
2.0000 g | Freq: Three times a day (TID) | INTRAVENOUS | Status: AC
  Administered 2018-05-25 – 2018-05-26 (×2): 2 g via INTRAVENOUS
  Filled 2018-05-25 (×2): qty 100

## 2018-05-25 MED ORDER — LIDOCAINE 2% (20 MG/ML) 5 ML SYRINGE
INTRAMUSCULAR | Status: AC
  Filled 2018-05-25: qty 5

## 2018-05-25 MED ORDER — ACETAMINOPHEN 650 MG RE SUPP: 650.0000 mg | RECTAL | Status: DC | PRN

## 2018-05-25 MED ORDER — SODIUM CHLORIDE 0.9% FLUSH: 3.0000 mL | INTRAVENOUS | Status: DC | PRN

## 2018-05-25 MED ORDER — ONDANSETRON HCL 4 MG/2ML IJ SOLN
INTRAMUSCULAR | Status: AC
  Filled 2018-05-25: qty 4

## 2018-05-25 MED ORDER — LIDOCAINE 2% (20 MG/ML) 5 ML SYRINGE
INTRAMUSCULAR | Status: DC | PRN
  Administered 2018-05-25: 80 mg via INTRAVENOUS

## 2018-05-25 MED ORDER — METHOCARBAMOL 500 MG PO TABS
500.0000 mg | ORAL_TABLET | Freq: Four times a day (QID) | ORAL | Status: DC | PRN
  Administered 2018-05-25 – 2018-05-29 (×3): 500 mg via ORAL
  Filled 2018-05-25 (×3): qty 1

## 2018-05-25 MED ORDER — CHLORHEXIDINE GLUCONATE CLOTH 2 % EX PADS: 6.0000 | MEDICATED_PAD | Freq: Once | CUTANEOUS | Status: DC

## 2018-05-25 MED ORDER — GLYCOPYRROLATE PF 0.2 MG/ML IJ SOSY
PREFILLED_SYRINGE | INTRAMUSCULAR | Status: AC
  Filled 2018-05-25: qty 2

## 2018-05-25 MED ORDER — PROMETHAZINE HCL 25 MG/ML IJ SOLN: 6.2500 mg | INTRAMUSCULAR | Status: DC | PRN

## 2018-05-25 MED ORDER — EPHEDRINE 5 MG/ML INJ
INTRAVENOUS | Status: AC
  Filled 2018-05-25: qty 10

## 2018-05-25 MED ORDER — FAMOTIDINE IN NACL 20-0.9 MG/50ML-% IV SOLN
20.0000 mg | Freq: Two times a day (BID) | INTRAVENOUS | Status: DC
  Administered 2018-05-25 (×2): 20 mg via INTRAVENOUS
  Filled 2018-05-25 (×2): qty 50

## 2018-05-25 MED ORDER — PROPOFOL 10 MG/ML IV BOLUS
INTRAVENOUS | Status: AC
  Filled 2018-05-25: qty 40

## 2018-05-25 MED ORDER — ZOLPIDEM TARTRATE 5 MG PO TABS: 5.0000 mg | ORAL_TABLET | Freq: Every evening | ORAL | Status: DC | PRN

## 2018-05-25 MED ORDER — ADULT MULTIVITAMIN W/MINERALS CH
1.0000 | ORAL_TABLET | Freq: Every day | ORAL | Status: DC
  Administered 2018-05-26 – 2018-05-29 (×4): 1 via ORAL
  Filled 2018-05-25 (×4): qty 1

## 2018-05-25 MED ORDER — ACETAMINOPHEN 325 MG PO TABS
650.0000 mg | ORAL_TABLET | ORAL | Status: DC | PRN
  Administered 2018-05-26: 650 mg via ORAL
  Filled 2018-05-25 (×2): qty 2

## 2018-05-25 MED ORDER — PROPOFOL 10 MG/ML IV BOLUS
INTRAVENOUS | Status: DC | PRN
  Administered 2018-05-25: 20 mg via INTRAVENOUS
  Administered 2018-05-25: 150 mg via INTRAVENOUS

## 2018-05-25 MED ORDER — LACTATED RINGERS IV SOLN
INTRAVENOUS | Status: DC | PRN
  Administered 2018-05-25: 07:00:00 via INTRAVENOUS

## 2018-05-25 MED ORDER — SODIUM CHLORIDE 0.9 % IV SOLN
250.0000 mL | INTRAVENOUS | Status: DC
  Administered 2018-05-25: 250 mL via INTRAVENOUS

## 2018-05-25 MED ORDER — DOCUSATE SODIUM 100 MG PO CAPS
100.0000 mg | ORAL_CAPSULE | Freq: Two times a day (BID) | ORAL | Status: DC
  Administered 2018-05-25 – 2018-05-29 (×9): 100 mg via ORAL
  Filled 2018-05-25 (×9): qty 1

## 2018-05-25 MED ORDER — PREGABALIN 50 MG PO CAPS
150.0000 mg | ORAL_CAPSULE | Freq: Every day | ORAL | Status: DC
  Administered 2018-05-26 – 2018-05-29 (×4): 150 mg via ORAL
  Filled 2018-05-25 (×4): qty 1

## 2018-05-25 MED ORDER — HYDROMORPHONE HCL 1 MG/ML IJ SOLN
0.2500 mg | INTRAMUSCULAR | Status: DC | PRN
  Administered 2018-05-25 (×2): 0.5 mg via INTRAVENOUS

## 2018-05-25 MED ORDER — POLYETHYLENE GLYCOL 3350 17 G PO PACK
17.0000 g | PACK | Freq: Every day | ORAL | Status: DC | PRN
  Administered 2018-05-28 – 2018-05-29 (×2): 17 g via ORAL
  Filled 2018-05-25 (×2): qty 1

## 2018-05-25 MED ORDER — SODIUM CHLORIDE 0.9% FLUSH
3.0000 mL | Freq: Two times a day (BID) | INTRAVENOUS | Status: DC
  Administered 2018-05-25 – 2018-05-29 (×7): 3 mL via INTRAVENOUS

## 2018-05-25 MED ORDER — ACETAMINOPHEN 10 MG/ML IV SOLN
INTRAVENOUS | Status: AC
  Filled 2018-05-25: qty 100

## 2018-05-25 MED ORDER — KCL IN DEXTROSE-NACL 20-5-0.45 MEQ/L-%-% IV SOLN
INTRAVENOUS | Status: DC
  Administered 2018-05-25: 16:00:00 via INTRAVENOUS
  Filled 2018-05-25 (×2): qty 1000

## 2018-05-25 MED ORDER — ONDANSETRON HCL 4 MG PO TABS: 4.0000 mg | ORAL_TABLET | Freq: Four times a day (QID) | ORAL | Status: DC | PRN

## 2018-05-25 MED ORDER — ACETAMINOPHEN 10 MG/ML IV SOLN
INTRAVENOUS | Status: DC | PRN
  Administered 2018-05-25: 1000 mg via INTRAVENOUS

## 2018-05-25 MED ORDER — HYDROCODONE-ACETAMINOPHEN 5-325 MG PO TABS
1.0000 | ORAL_TABLET | ORAL | Status: DC | PRN
  Administered 2018-05-27 – 2018-05-29 (×9): 1 via ORAL
  Filled 2018-05-25 (×9): qty 1

## 2018-05-25 MED ORDER — HYDROMORPHONE HCL 1 MG/ML IJ SOLN
1.0000 mg | INTRAMUSCULAR | Status: DC | PRN
  Administered 2018-05-26 (×2): 1 mg via INTRAVENOUS
  Filled 2018-05-25 (×2): qty 1

## 2018-05-25 MED ORDER — PHENOL 1.4 % MT LIQD: 1.0000 | OROMUCOSAL | Status: DC | PRN

## 2018-05-25 MED ORDER — METHOCARBAMOL 1000 MG/10ML IJ SOLN
500.0000 mg | Freq: Four times a day (QID) | INTRAVENOUS | Status: DC | PRN
  Filled 2018-05-25: qty 5

## 2018-05-25 MED ORDER — CEFAZOLIN SODIUM-DEXTROSE 2-4 GM/100ML-% IV SOLN
INTRAVENOUS | Status: AC
  Filled 2018-05-25: qty 100

## 2018-05-25 MED ORDER — BUPIVACAINE LIPOSOME 1.3 % IJ SUSP
20.0000 mL | INTRAMUSCULAR | Status: AC
  Administered 2018-05-25: 20 mL
  Filled 2018-05-25: qty 20

## 2018-05-25 MED ORDER — LIDOCAINE-EPINEPHRINE 1 %-1:100000 IJ SOLN
INTRAMUSCULAR | Status: DC | PRN
  Administered 2018-05-25: 20 mL
  Administered 2018-05-25: 10 mL

## 2018-05-25 MED ORDER — FENTANYL CITRATE (PF) 100 MCG/2ML IJ SOLN
INTRAMUSCULAR | Status: DC | PRN
  Administered 2018-05-25: 50 ug via INTRAVENOUS
  Administered 2018-05-25 (×3): 25 ug via INTRAVENOUS
  Administered 2018-05-25: 100 ug via INTRAVENOUS
  Administered 2018-05-25 (×2): 25 ug via INTRAVENOUS
  Administered 2018-05-25: 50 ug via INTRAVENOUS

## 2018-05-25 MED ORDER — SUCCINYLCHOLINE CHLORIDE 200 MG/10ML IV SOSY
PREFILLED_SYRINGE | INTRAVENOUS | Status: AC
  Filled 2018-05-25: qty 10

## 2018-05-25 MED ORDER — ONDANSETRON HCL 4 MG/2ML IJ SOLN
INTRAMUSCULAR | Status: DC | PRN
  Administered 2018-05-25: 4 mg via INTRAVENOUS

## 2018-05-25 MED ORDER — RAMIPRIL 2.5 MG PO CAPS
2.5000 mg | ORAL_CAPSULE | Freq: Every day | ORAL | Status: DC
  Administered 2018-05-25 – 2018-05-29 (×5): 2.5 mg via ORAL
  Filled 2018-05-25 (×7): qty 1

## 2018-05-25 MED ORDER — DEXAMETHASONE SODIUM PHOSPHATE 10 MG/ML IJ SOLN
INTRAMUSCULAR | Status: DC | PRN
  Administered 2018-05-25: 10 mg via INTRAVENOUS

## 2018-05-25 MED ORDER — BISACODYL 10 MG RE SUPP: 10.0000 mg | Freq: Every day | RECTAL | Status: DC | PRN

## 2018-05-25 MED ORDER — SODIUM CHLORIDE 0.9 % IV SOLN
INTRAVENOUS | Status: DC | PRN
  Administered 2018-05-25: 30 ug/min via INTRAVENOUS

## 2018-05-25 MED ORDER — PROPOFOL 500 MG/50ML IV EMUL
INTRAVENOUS | Status: DC | PRN
  Administered 2018-05-25: 90 ug/kg/min via INTRAVENOUS

## 2018-05-25 MED ORDER — ALBUMIN HUMAN 5 % IV SOLN
INTRAVENOUS | Status: DC | PRN
  Administered 2018-05-25: 09:00:00 via INTRAVENOUS

## 2018-05-25 MED ORDER — SCOPOLAMINE 1 MG/3DAYS TD PT72
MEDICATED_PATCH | TRANSDERMAL | Status: AC
  Administered 2018-05-25: 1.5 mg via TRANSDERMAL
  Filled 2018-05-25: qty 1

## 2018-05-25 MED ORDER — SCOPOLAMINE 1 MG/3DAYS TD PT72
1.0000 | MEDICATED_PATCH | TRANSDERMAL | Status: DC
  Administered 2018-05-25: 1.5 mg via TRANSDERMAL

## 2018-05-25 MED ORDER — DOXAZOSIN MESYLATE 8 MG PO TABS
4.0000 mg | ORAL_TABLET | Freq: Every day | ORAL | Status: DC
  Administered 2018-05-26 – 2018-05-29 (×4): 4 mg via ORAL
  Filled 2018-05-25 (×4): qty 1

## 2018-05-25 MED ORDER — PANTOPRAZOLE SODIUM 40 MG PO TBEC: 40.0000 mg | DELAYED_RELEASE_TABLET | Freq: Every day | ORAL | Status: DC | PRN

## 2018-05-25 MED ORDER — PHENYLEPHRINE 40 MCG/ML (10ML) SYRINGE FOR IV PUSH (FOR BLOOD PRESSURE SUPPORT)
PREFILLED_SYRINGE | INTRAVENOUS | Status: DC | PRN
  Administered 2018-05-25: 80 ug via INTRAVENOUS

## 2018-05-25 MED ORDER — ALUM & MAG HYDROXIDE-SIMETH 200-200-20 MG/5ML PO SUSP: 30.0000 mL | Freq: Four times a day (QID) | ORAL | Status: DC | PRN

## 2018-05-25 MED ORDER — FINASTERIDE 5 MG PO TABS
5.0000 mg | ORAL_TABLET | Freq: Every day | ORAL | Status: DC
  Administered 2018-05-25 – 2018-05-29 (×5): 5 mg via ORAL
  Filled 2018-05-25 (×5): qty 1

## 2018-05-25 MED ORDER — BUPIVACAINE HCL (PF) 0.5 % IJ SOLN
INTRAMUSCULAR | Status: AC
  Filled 2018-05-25: qty 30

## 2018-05-25 MED ORDER — CEFAZOLIN SODIUM-DEXTROSE 2-3 GM-%(50ML) IV SOLR
INTRAVENOUS | Status: DC | PRN
  Administered 2018-05-25 (×2): 2 g via INTRAVENOUS

## 2018-05-25 MED ORDER — PHENYLEPHRINE 40 MCG/ML (10ML) SYRINGE FOR IV PUSH (FOR BLOOD PRESSURE SUPPORT)
PREFILLED_SYRINGE | INTRAVENOUS | Status: AC
  Filled 2018-05-25: qty 10

## 2018-05-25 MED ORDER — HYDROMORPHONE HCL 1 MG/ML IJ SOLN
INTRAMUSCULAR | Status: AC
  Filled 2018-05-25: qty 1

## 2018-05-25 MED ORDER — ONDANSETRON HCL 4 MG/2ML IJ SOLN
4.0000 mg | Freq: Four times a day (QID) | INTRAMUSCULAR | Status: DC | PRN
  Administered 2018-05-28: 4 mg via INTRAVENOUS
  Filled 2018-05-25: qty 2

## 2018-05-25 MED ORDER — 0.9 % SODIUM CHLORIDE (POUR BTL) OPTIME
TOPICAL | Status: DC | PRN
  Administered 2018-05-25: 1000 mL

## 2018-05-25 MED ORDER — EPHEDRINE SULFATE-NACL 50-0.9 MG/10ML-% IV SOSY
PREFILLED_SYRINGE | INTRAVENOUS | Status: DC | PRN
  Administered 2018-05-25 (×2): 5 mg via INTRAVENOUS

## 2018-05-25 MED ORDER — CEFAZOLIN SODIUM-DEXTROSE 2-4 GM/100ML-% IV SOLN: 2.0000 g | INTRAVENOUS | Status: DC

## 2018-05-25 SURGICAL SUPPLY — 79 items
BLADE CLIPPER SURG (BLADE) IMPLANT
CAGE MODULUS XLW 10X22X60 - 10 (Cage) ×8 IMPLANT
CARTRIDGE OIL MAESTRO DRILL (MISCELLANEOUS) IMPLANT
CLIP NEUROVISION LG (CLIP) ×4 IMPLANT
CONT SPEC 4OZ CLIKSEAL STRL BL (MISCELLANEOUS) ×4 IMPLANT
COVER BACK TABLE 24X17X13 BIG (DRAPES) IMPLANT
COVER BACK TABLE 60X90IN (DRAPES) ×4 IMPLANT
DECANTER SPIKE VIAL GLASS SM (MISCELLANEOUS) ×8 IMPLANT
DERMABOND ADVANCED (GAUZE/BANDAGES/DRESSINGS) ×4
DERMABOND ADVANCED .7 DNX12 (GAUZE/BANDAGES/DRESSINGS) ×4 IMPLANT
DIFFUSER DRILL AIR PNEUMATIC (MISCELLANEOUS) IMPLANT
DRAPE C-ARM 42X72 X-RAY (DRAPES) ×8 IMPLANT
DRAPE C-ARMOR (DRAPES) ×8 IMPLANT
DRAPE LAPAROTOMY 100X72X124 (DRAPES) ×8 IMPLANT
DRAPE POUCH INSTRU U-SHP 10X18 (DRAPES) IMPLANT
DRAPE SURG 17X23 STRL (DRAPES) ×4 IMPLANT
DRSG OPSITE POSTOP 3X4 (GAUZE/BANDAGES/DRESSINGS) ×4 IMPLANT
DRSG OPSITE POSTOP 4X6 (GAUZE/BANDAGES/DRESSINGS) ×12 IMPLANT
DURAPREP 26ML APPLICATOR (WOUND CARE) ×8 IMPLANT
ELECT REM PT RETURN 9FT ADLT (ELECTROSURGICAL) ×8
ELECTRODE REM PT RTRN 9FT ADLT (ELECTROSURGICAL) ×4 IMPLANT
GAUZE 4X4 16PLY RFD (DISPOSABLE) IMPLANT
GAUZE SPONGE 4X4 12PLY STRL (GAUZE/BANDAGES/DRESSINGS) ×4 IMPLANT
GLOVE BIO SURGEON STRL SZ8 (GLOVE) ×12 IMPLANT
GLOVE BIOGEL PI IND STRL 7.0 (GLOVE) ×4 IMPLANT
GLOVE BIOGEL PI IND STRL 8 (GLOVE) ×4 IMPLANT
GLOVE BIOGEL PI IND STRL 8.5 (GLOVE) ×6 IMPLANT
GLOVE BIOGEL PI INDICATOR 7.0 (GLOVE) ×4
GLOVE BIOGEL PI INDICATOR 8 (GLOVE) ×4
GLOVE BIOGEL PI INDICATOR 8.5 (GLOVE) ×6
GLOVE ECLIPSE 8.0 STRL XLNG CF (GLOVE) ×8 IMPLANT
GLOVE EXAM NITRILE LRG STRL (GLOVE) IMPLANT
GLOVE EXAM NITRILE XL STR (GLOVE) IMPLANT
GLOVE EXAM NITRILE XS STR PU (GLOVE) IMPLANT
GLOVE SURG SS PI 6.5 STRL IVOR (GLOVE) ×12 IMPLANT
GLOVE SURG SS PI 7.5 STRL IVOR (GLOVE) ×4 IMPLANT
GOWN STRL REUS W/ TWL LRG LVL3 (GOWN DISPOSABLE) ×6 IMPLANT
GOWN STRL REUS W/ TWL XL LVL3 (GOWN DISPOSABLE) ×4 IMPLANT
GOWN STRL REUS W/TWL 2XL LVL3 (GOWN DISPOSABLE) ×8 IMPLANT
GOWN STRL REUS W/TWL LRG LVL3 (GOWN DISPOSABLE) ×6
GOWN STRL REUS W/TWL XL LVL3 (GOWN DISPOSABLE) ×4
GUIDEWIRE NITINOL BEVEL TIP (WIRE) ×32 IMPLANT
KIT BASIN OR (CUSTOM PROCEDURE TRAY) ×8 IMPLANT
KIT DILATOR XLIF 5 (KITS) ×3 IMPLANT
KIT INFUSE SMALL (Orthopedic Implant) ×4 IMPLANT
KIT POSITION SURG JACKSON T1 (MISCELLANEOUS) ×4 IMPLANT
KIT SURGICAL ACCESS MAXCESS 4 (KITS) ×4 IMPLANT
KIT TURNOVER KIT B (KITS) ×4 IMPLANT
KIT XLIF (KITS) ×1
MARKER SKIN DUAL TIP RULER LAB (MISCELLANEOUS) ×4 IMPLANT
MODULE NVM5 NEXT GEN EMG (NEEDLE) ×4 IMPLANT
MODULUS XLW 12X22X60MM 10 (Spine Construct) ×4 IMPLANT
NEEDLE HYPO 21X1.5 SAFETY (NEEDLE) ×4 IMPLANT
NEEDLE HYPO 25X1 1.5 SAFETY (NEEDLE) ×8 IMPLANT
NEEDLE I PASS (NEEDLE) ×4 IMPLANT
NS IRRIG 1000ML POUR BTL (IV SOLUTION) ×8 IMPLANT
OIL CARTRIDGE MAESTRO DRILL (MISCELLANEOUS)
PACK LAMINECTOMY NEURO (CUSTOM PROCEDURE TRAY) ×4 IMPLANT
PAD ARMBOARD 7.5X6 YLW CONV (MISCELLANEOUS) ×12 IMPLANT
PATTIES SURGICAL .5 X.5 (GAUZE/BANDAGES/DRESSINGS) IMPLANT
PATTIES SURGICAL .5 X1 (DISPOSABLE) IMPLANT
PATTIES SURGICAL 1X1 (DISPOSABLE) IMPLANT
PUTTY BONE ATTRAX 10CC STRIP (Putty) ×4 IMPLANT
ROD RELINE MAS LORD 5.5X90MM (Rod) ×8 IMPLANT
SCREW LOCK RELINE 5.5 TULIP (Screw) ×32 IMPLANT
SCREW MAS RELINE 6.5X45 POLY (Screw) ×4 IMPLANT
SCREW MAS RELINE 6.5X50 POLY (Screw) ×28 IMPLANT
SPONGE LAP 4X18 RFD (DISPOSABLE) IMPLANT
STAPLER SKIN PROX WIDE 3.9 (STAPLE) ×4 IMPLANT
SUT VIC AB 1 CT1 18XBRD ANBCTR (SUTURE) ×6 IMPLANT
SUT VIC AB 1 CT1 8-18 (SUTURE) ×6
SUT VIC AB 2-0 CT1 18 (SUTURE) ×12 IMPLANT
SUT VIC AB 3-0 SH 8-18 (SUTURE) ×16 IMPLANT
SYR INSULIN 1ML 31GX6 SAFETY (SYRINGE) IMPLANT
SYRINGE 20CC LL (MISCELLANEOUS) ×4 IMPLANT
TOWEL GREEN STERILE (TOWEL DISPOSABLE) ×4 IMPLANT
TOWEL GREEN STERILE FF (TOWEL DISPOSABLE) ×4 IMPLANT
TRAY FOLEY MTR SLVR 16FR STAT (SET/KITS/TRAYS/PACK) ×4 IMPLANT
WATER STERILE IRR 1000ML POUR (IV SOLUTION) ×4 IMPLANT

## 2018-05-25 NOTE — Op Note (Signed)
05/25/2018  12:34 PM  PATIENT:  Mark Warren.  72 y.o. male  PRE-OPERATIVE DIAGNOSIS:  Lumbar stenosis with neurogenic claudication, lumbar spondylolisthesis, degenerative disc disease, lumbar radiculopathy, lumbago L 23, L 34, L 45 levels  POST-OPERATIVE DIAGNOSIS:   Lumbar stenosis with neurogenic claudication, lumbar spondylolisthesis, degenerative disc disease, lumbar radiculopathy, lumbago L 23, L 34, L 45 levels  PROCEDURE:  Procedure(s): Right Lumbar two-three Lumbar three-four Lumbar four-five  Anterolateral lumbar interbody decompression/fusion with percutaneous pedicle screw fixation (Right) LUMBAR TWO-FIVE PERCUTANEOUS PEDICLE SCREW (N/A)   SURGEON:  Surgeon(s) and Role:    Erline Levine, MD - Primary  PHYSICIAN ASSISTANT:   ASSISTANTS: Poteat, RN   ANESTHESIA:   general  EBL:  100 mL   BLOOD ADMINISTERED:none  DRAINS: none   LOCAL MEDICATIONS USED:  MARCAINE    and LIDOCAINE   SPECIMEN:  No Specimen  DISPOSITION OF SPECIMEN:  N/A  COUNTS:  YES  TOURNIQUET:  * No tourniquets in log *  DICTATION: DICTATION: Patient is a 72 year old with severe spondylosis stenosis and disc degeneration of the lumbar spine. It was elected to take him to surgery for anterolateral decompression and posterior pedicle screw fixation at L 23, L 34, L 45 levels.  Procedure: Patient was brought to the operating room and placed in a lateral decubitus position on the operative table and using orthogonally projected C-arm fluoroscopy the patient was placed so that the L23 L34 and L 45 levels were visualized in AP and lateral plane. The patient was then taped into position. The table was flexed so as to expose the L 45 level as the patient has a high iliac crest. Skin was marked along with a posterior finger dissection incision. His flank was then prepped and draped in usual sterile fashion and incisions were made sequentially at L 45,  L34 and L23 levels. Posterior finger dissection was  made to enter the retroperitoneal space and then subsequently the probe was inserted into the psoas muscle from the right side initially at the L 45 level. After mapping the neural elements were able to dock the probe per the midpoint of this vertebral level and without indications electrically of too close proximity to the neural tissues. Subsequently the self-retaining tractor was.after sequential dilators were utilized the shim was employed and the interspace was cleared of psoas muscle and then incised. A thorough discectomy was performed. Instruments were used to clear the interspace of disc material.An anterior entry with posterior trajectory was performed to avoid neural elements.   After thorough discectomy was performed and this was performed using AP and lateral fluoroscopy a 10 lordotic by 60 x 22 mm implant was packed with BMP and Attrax. This was tamped into position and its position was confirmed on AP and lateral fluoroscopy. Subsequently exposure was performed at the L34 level and similar dissection was performed with locking of the self-retaining retractor. At this level were able to place an 12 lordotic by 22 x 88mm implant packed in a similar fashion. At the L23 level were able to place an 10 mm lordotic by 60 x 22 mm implant packed in a similar fashion. Hemostasis was assured the wounds were irrigated and closed with interrupted Vicryl sutures.  Sterile occlusive dressings were placed. Retractor times were:  L 45: 15 minutes;  L 34: 19 minutes; L 23: 15 minutes.   Patient was then turned into a prone position on the operating table on Ferguson table and using AP and lateral fluoroscopy throughout  this portion of the procedure, pedicle screws were placed using Reline Nuvasive cannulated percutaneous screws. 2 screws were placed at L2 and (6.5 x 45 mm right and 6.5 x 50 mm left) and 2 at L3 (6.5 x 50), two at L4 of a similar size,  2 at L5 (6.5 x 50). 90 mm rod was then affixed to the screw  heads and locked down on the screws on the left and 90 mm rod on the right. All connections were then torqued and the Towers were disassembled. The wounds were irrigated and then closed with 1, 2-0 and 3-0 Vicryl stitches. Sterile occlusive dressing was placed with Dermabond. Long-acting Marcaine was injected. The patient was then extubated in the operating room and taken to recovery in stable and satisfactory condition having tolerated his operation well. Counts were correct at the end of the case.  Pelvic Parameters:  Preop: PT 13; PI 63; LL63; PI-LL 0, SVA 68  PLAN OF CARE: Admit to inpatient   PATIENT DISPOSITION:  PACU - hemodynamically stable.   Delay start of Pharmacological VTE agent (>24hrs) due to surgical blood loss or risk of bleeding: yes

## 2018-05-25 NOTE — Interval H&P Note (Signed)
History and Physical Interval Note:  05/25/2018 7:35 AM  Mark Warren.  has presented today for surgery, with the diagnosis of Lumbar stenosis with neurogenic claudication  The various methods of treatment have been discussed with the patient and family. After consideration of risks, benefits and other options for treatment, the patient has consented to  Procedure(s) with comments: Right Lumbar 2-3 Lumbar 3-4 Lumbar 4-5 Anterolateral lumbar interbody decompression/fusion with percutaneous pedicle screw fixation (Right) - Right Lumbar 2-3 Lumbar 3-4 Lumbar 4-5 Anterolateral lumbar interbody decompression/fusion with percutaneous pedicle screw fixation LUMBAR PERCUTANEOUS PEDICLE SCREW 3 LEVEL (N/A) as a surgical intervention .  The patient's history has been reviewed, patient examined, no change in status, stable for surgery.  I have reviewed the patient's chart and labs.  Questions were answered to the patient's satisfaction.     [name] D

## 2018-05-25 NOTE — Transfer of Care (Signed)
Immediate Anesthesia Transfer of Care Note  Patient: Mark Warren.  Procedure(s) Performed: Right Lumbar two-three Lumbar three-four Lumbar four-five  Anterolateral lumbar interbody decompression/fusion with percutaneous pedicle screw fixation (Right ) LUMBAR TWO-FIVE PERCUTANEOUS PEDICLE SCREW (N/A )  Patient Location: PACU  Anesthesia Type:General  Level of Consciousness: drowsy  Airway & Oxygen Therapy: Patient Spontanous Breathing and Patient connected to face mask oxygen  Post-op Assessment: Report given to RN and Post -op Vital signs reviewed and stable  Post vital signs: Reviewed and stable  Last Vitals:  Vitals Value Taken Time  BP 133/78 05/25/2018 12:51 PM  Temp    Pulse 84 05/25/2018 12:54 PM  Resp 19 05/25/2018 12:54 PM  SpO2 99 % 05/25/2018 12:54 PM  Vitals shown include unvalidated device data.  Last Pain:  Vitals:   05/25/18 0610  TempSrc:   PainSc: 0-No pain      Patients Stated Pain Goal: 3 (76/19/50 9326)  Complications: No apparent anesthesia complications

## 2018-05-25 NOTE — Social Work (Addendum)
CSW acknowledging consult for SNF, will follow for therapy recommendations. Pt will need an inpatient three night medically necessary stay for SNF approval.   Alexander [place], [place] Work [phone]

## 2018-05-25 NOTE — Progress Notes (Addendum)
Patient ID: Mark Warren., male   DOB: February 11, 1946, 72 y.o.   MRN: [id number] Awake, follows commands. Rates lumbar pain at 4/10. Good strength BLE.   Patient is doing well.

## 2018-05-25 NOTE — Anesthesia Postprocedure Evaluation (Signed)
Anesthesia Post Note  Patient: Mark Warren.  Procedure(s) Performed: Right Lumbar two-three Lumbar three-four Lumbar four-five  Anterolateral lumbar interbody decompression/fusion with percutaneous pedicle screw fixation (Right ) LUMBAR TWO-FIVE PERCUTANEOUS PEDICLE SCREW (N/A )     Patient location during evaluation: PACU Anesthesia Type: General Level of consciousness: sedated Pain management: pain level controlled Vital Signs Assessment: post-procedure vital signs reviewed and stable Respiratory status: spontaneous breathing and respiratory function stable Cardiovascular status: stable Postop Assessment: no apparent nausea or vomiting Anesthetic complications: no    Last Vitals:  Vitals:   05/25/18 1330 05/25/18 1345  BP:    Pulse: 85 83  Resp: 19 18  Temp:    SpO2: 97% 97%    Last Pain:  Vitals:   05/25/18 1330  TempSrc:   PainSc: Asleep                 Trino Higinbotham DANIEL

## 2018-05-25 NOTE — Anesthesia Procedure Notes (Signed)
Procedure Name: Intubation Date/Time: 05/25/2018 7:45 AM Performed by: Cleda Daub, CRNA Pre-anesthesia Checklist: Patient identified, Emergency Drugs available, Suction available and Patient being monitored Patient Re-evaluated:Patient Re-evaluated prior to induction Oxygen Delivery Method: Circle system utilized Preoxygenation: Pre-oxygenation with 100% oxygen Induction Type: IV induction Laryngoscope Size: Mac and 3 Grade View: Grade I Tube type: Oral Tube size: 8.0 mm Number of attempts: 1 Airway Equipment and Method: Stylet Placement Confirmation: ETT inserted through vocal cords under direct vision,  positive ETCO2 and breath sounds checked- equal and bilateral Secured at: 23 cm Tube secured with: Tape Dental Injury: Teeth and Oropharynx as per pre-operative assessment

## 2018-05-25 NOTE — Brief Op Note (Signed)
05/25/2018  12:34 PM  PATIENT:  Mark Warren.  72 y.o. male  PRE-OPERATIVE DIAGNOSIS:  Lumbar stenosis with neurogenic claudication, lumbar spondylolisthesis, degenerative disc disease, lumbar radiculopathy, lumbago L 23, L 34, L 45 levels  POST-OPERATIVE DIAGNOSIS:   Lumbar stenosis with neurogenic claudication, lumbar spondylolisthesis, degenerative disc disease, lumbar radiculopathy, lumbago L 23, L 34, L 45 levels  PROCEDURE:  Procedure(s): Right Lumbar two-three Lumbar three-four Lumbar four-five  Anterolateral lumbar interbody decompression/fusion with percutaneous pedicle screw fixation (Right) LUMBAR TWO-FIVE PERCUTANEOUS PEDICLE SCREW (N/A)   SURGEON:  Surgeon(s) and Role:    Erline Levine, MD - Primary  PHYSICIAN ASSISTANT:   ASSISTANTS: Poteat, RN   ANESTHESIA:   general  EBL:  100 mL   BLOOD ADMINISTERED:none  DRAINS: none   LOCAL MEDICATIONS USED:  MARCAINE    and LIDOCAINE   SPECIMEN:  No Specimen  DISPOSITION OF SPECIMEN:  N/A  COUNTS:  YES  TOURNIQUET:  * No tourniquets in log *  DICTATION: DICTATION: Patient is a 72 year old with severe spondylosis stenosis and disc degeneration of the lumbar spine. It was elected to take him to surgery for anterolateral decompression and posterior pedicle screw fixation at L 23, L 34, L 45 levels.  Procedure: Patient was brought to the operating room and placed in a lateral decubitus position on the operative table and using orthogonally projected C-arm fluoroscopy the patient was placed so that the L23 L34 and L 45 levels were visualized in AP and lateral plane. The patient was then taped into position. The table was flexed so as to expose the L 45 level as the patient has a high iliac crest. Skin was marked along with a posterior finger dissection incision. His flank was then prepped and draped in usual sterile fashion and incisions were made sequentially at L 45,  L34 and L23 levels. Posterior finger dissection was  made to enter the retroperitoneal space and then subsequently the probe was inserted into the psoas muscle from the right side initially at the L 45 level. After mapping the neural elements were able to dock the probe per the midpoint of this vertebral level and without indications electrically of too close proximity to the neural tissues. Subsequently the self-retaining tractor was.after sequential dilators were utilized the shim was employed and the interspace was cleared of psoas muscle and then incised. A thorough discectomy was performed. Instruments were used to clear the interspace of disc material.An anterior entry with posterior trajectory was performed to avoid neural elements.   After thorough discectomy was performed and this was performed using AP and lateral fluoroscopy a 10 lordotic by 60 x 22 mm implant was packed with BMP and Attrax. This was tamped into position and its position was confirmed on AP and lateral fluoroscopy. Subsequently exposure was performed at the L34 level and similar dissection was performed with locking of the self-retaining retractor. At this level were able to place an 12 lordotic by 22 x 10mm implant packed in a similar fashion. At the L23 level were able to place an 10 mm lordotic by 60 x 22 mm implant packed in a similar fashion. Hemostasis was assured the wounds were irrigated and closed with interrupted Vicryl sutures.  Sterile occlusive dressings were placed. Retractor times were:  L 45: 15 minutes;  L 34: 19 minutes; L 23: 15 minutes.   Patient was then turned into a prone position on the operating table on [place] table and using AP and lateral fluoroscopy throughout  this portion of the procedure, pedicle screws were placed using Reline Nuvasive cannulated percutaneous screws. 2 screws were placed at L2 and (6.5 x 45 mm right and 6.5 x 50 mm left) and 2 at L3 (6.5 x 50), two at L4 of a similar size,  2 at L5 (6.5 x 50). 90 mm rod was then affixed to the screw  heads and locked down on the screws on the left and 90 mm rod on the right. All connections were then torqued and the Towers were disassembled. The wounds were irrigated and then closed with 1, 2-0 and 3-0 Vicryl stitches. Sterile occlusive dressing was placed with Dermabond. Long-acting Marcaine was injected. The patient was then extubated in the operating room and taken to recovery in stable and satisfactory condition having tolerated his operation well. Counts were correct at the end of the case.  Pelvic Parameters:  Preop: PT 13; PI 63; LL63; PI-LL 0, SVA 68  PLAN OF CARE: Admit to inpatient   PATIENT DISPOSITION:  PACU - hemodynamically stable.   Delay start of Pharmacological VTE agent (>24hrs) due to surgical blood loss or risk of bleeding: yes

## 2018-05-26 MED ORDER — FAMOTIDINE 20 MG PO TABS
20.0000 mg | ORAL_TABLET | Freq: Two times a day (BID) | ORAL | Status: DC
  Administered 2018-05-26 – 2018-05-29 (×7): 20 mg via ORAL
  Filled 2018-05-26 (×7): qty 1

## 2018-05-26 NOTE — Evaluation (Signed)
Occupational Therapy Evaluation Patient Details Name: Mark Warren. MRN: [id number] DOB: 1946/10/01 Today's Date: 05/26/2018    History of Present Illness 72 yo male s/p XLIF L2-3 L3-4 L4-5 PMH: DOE HTN trigeminal neuralgia R shoulder surg    Clinical Impression   Patient is s/p XLIF L2-3 L3-4 L4-5 surgery resulting in functional limitations due to the deficits listed below (see OT problem list). Pt currently requires (A) for static sitting balance and basic transfer. Pt unable to sustain static standing due to bil Le weakness on this eval. Pt asking questions regarding home and informed that home is not recommended at this time based on evaluation. Pt will need to progress to ability to demonstrate bed mobility supervision and supervision with RW to d/c home.  Patient will benefit from skilled OT acutely to increase independence and safety with ADLS to allow discharge SNF.     Follow Up Recommendations  SNF    Equipment Recommendations  3 in 1 bedside commode;Other (comment)(RW)    Recommendations for Other Services       Precautions / Restrictions Precautions Precautions: Back Precaution Comments: back handout provided and reviewed for adls Required Braces or Orthoses: Spinal Brace Spinal Brace: Lumbar corset;Applied in sitting position      Mobility Bed Mobility Overal bed mobility: Needs Assistance Bed Mobility: Rolling;Supine to Sit Rolling: Mod assist   Supine to sit: Max assist     General bed mobility comments: Pt educated on log roll sequence and needs (A) to complete trunk elevation from bed surface. pt reaching with hands for any environmental supports . pt educated on not twisting  Transfers Overall transfer level: Needs assistance Equipment used: Rolling walker (2 wheeled) Transfers: Risk manager;Sit to/from Stand Sit to Stand: Mod assist;From elevated surface Stand pivot transfers: Mod assist;From elevated surface       General transfer  comment: Pt unable to sustain static standing and progressed to chair position. pt educated on sitting for short periods of time.    Balance Overall balance assessment: Needs assistance Sitting-balance support: Bilateral upper extremity supported;Feet supported Sitting balance-Leahy Scale: Poor Sitting balance - Comments: pt unable to static sit without min (A)   Standing balance support: Bilateral upper extremity supported;During functional activity Standing balance-Leahy Scale: Poor Standing balance comment: relies on RW and therapist for support                           ADL either performed or assessed with clinical judgement   ADL Overall ADL's : Needs assistance/impaired Eating/Feeding: Set up;Sitting   Grooming: Set up;Sitting Grooming Details (indicate cue type and reason): pt completed oral care in the chair with cues for no bending. wife (A)ing patient due use of 1 UE for balance in chair Upper Body Bathing: Moderate assistance   Lower Body Bathing: Maximal assistance   Upper Body Dressing : Moderate assistance   Lower Body Dressing: Maximal assistance   Toilet Transfer: Maximal assistance Toilet Transfer Details (indicate cue type and reason): stand pivot only.            General ADL Comments: pt with knee flexion and descend to bed surface on first and second attempts at static standing. Pt asked why he was sinking back to the bed and pt states "i dont know" pt noted to have cognitive deficits and joking to answer questions. wife present and reports 'yes he has been confused"Pt seems shocked by wife's response and says "really you think  so"   Back handout provided and reviewed adls in detail. Pt educated on: clothing between brace, never sleep in brace, avoid sitting for long periods of time, correct bed positioning for sleeping, correct sequence for bed mobility, avoiding lifting more than 5 pounds and never wash directly over incision.    Vision  Baseline Vision/History: Wears glasses Wears Glasses: At all times       Perception     Praxis      Pertinent Vitals/Pain Pain Assessment: 0-10 Pain Score: 7  Pain Location: back Pain Descriptors / Indicators: Discomfort Pain Intervention(s): Monitored during session;Premedicated before session;Repositioned     Hand Dominance Right   Extremity/Trunk Assessment Upper Extremity Assessment Upper Extremity Assessment: Overall WFL for tasks assessed   Lower Extremity Assessment Lower Extremity Assessment: Defer to PT evaluation   Cervical / Trunk Assessment Cervical / Trunk Assessment: Other exceptions Cervical / Trunk Exceptions: (s/p surg)   Communication Communication Communication: No difficulties   Cognition Arousal/Alertness: Awake/alert Behavior During Therapy: WFL for tasks assessed/performed Overall Cognitive Status: Within Functional Limits for tasks assessed                                     General Comments  dressings dry and intact x4. wife educated on clean linen and avoiding washing directly on site.     Exercises     Shoulder Instructions      Home Living Family/patient expects to be discharged to:: Private residence Living Arrangements: Spouse/significant other Available Help at Discharge: Family;Available 24 hours/day Type of Home: House Home Access: Level entry     Home Layout: One level     Bathroom Shower/Tub: Occupational psychologist: Handicapped height     Home Equipment: [place] - single point;Adaptive equipment Adaptive Equipment: Other (Comment)(shoe horns) Additional Comments: retired Theme park manager      Prior Functioning/Environment Level of Independence: Independent        Comments: has a Doctor, hospital named "[facility]"        OT Problem List: Decreased activity tolerance;Decreased cognition;Decreased coordination;Decreased knowledge of precautions;Decreased knowledge of use of DME or AE;Decreased safety  awareness;Decreased strength;Impaired balance (sitting and/or standing);Pain;Obesity      OT Treatment/Interventions: Self-care/ADL training;Therapeutic exercise;Therapeutic activities;Cognitive remediation/compensation;Patient/family education;Balance training;DME and/or AE instruction    OT Goals(Current goals can be found in the care plan section) Acute Rehab OT Goals Patient Stated Goal: to go home  OT Goal Formulation: With patient/family Time For Goal Achievement: 06/09/18 Potential to Achieve Goals: Good  OT Frequency: Min 3X/week   Barriers to D/C:            Co-evaluation              AM-PAC PT "6 Clicks" Daily Activity     Outcome Measure Help from another person eating meals?: A Little Help from another person taking care of personal grooming?: A Little Help from another person toileting, which includes using toliet, bedpan, or urinal?: A Lot Help from another person bathing (including washing, rinsing, drying)?: A Lot Help from another person to put on and taking off regular upper body clothing?: A Lot Help from another person to put on and taking off regular lower body clothing?: A Lot 6 Click Score: 14   End of Session Equipment Utilized During Treatment: Gait belt;Rolling walker;Back brace Nurse Communication: Mobility status;Precautions  Activity Tolerance: Patient tolerated treatment well Patient left: in chair;with call bell/phone within  reach;with family/visitor present  OT Visit Diagnosis: Unsteadiness on feet (R26.81);Muscle weakness (generalized) (M62.81);Pain Pain - part of body: (back)                Time: 1000-1056 OT Time Calculation (min): 56 min Charges:  OT General Charges $OT Visit: 1 Visit OT Evaluation $OT Eval Moderate Complexity: 1 Mod OT Treatments $Self Care/Home Management : 23-37 mins   Jeri Modena   OTR/L Pager: [phone] Office: [phone] .   Parke Poisson B 05/26/2018, 11:09 AM

## 2018-05-26 NOTE — Social Work (Addendum)
CSW has sent referral to [facility] and [facility] per pt request.   [facility] able to offer placement, [facility] is reviewing clinicals.  If pt continues to require inpatient stay 3 night medical necessity will be met on 8/11.  5:30pm- [facility] liaison called CSW phone, able to offer placement if pt discharges Monday, will updates handoff for weekend CSW with these details.   Isabel H Chasse, LCSWA [place] Clinical Social Work [phone]   

## 2018-05-26 NOTE — Evaluation (Signed)
Physical Therapy Evaluation Patient Details Name: Mark Warren. MRN: [id number] DOB: Jan 10, 1946 Today's Date: 05/26/2018   History of Present Illness  72 yo male s/p XLIF L2-3 L3-4 L4-5 PMH: DOE HTN trigeminal neuralgia R shoulder surg   Clinical Impression  Patient presents with decreased independence with mobility and currently mod to max A for in room mobility.  Also disoriented to place and situation and time.  Feel he will need skilled PT in the acute setting to allow maximized mobility prior to d/c to SNF level rehab.    Follow Up Recommendations SNF;Supervision/Assistance - 24 hour    Equipment Recommendations  Rolling walker with 5" wheels    Recommendations for Other Services       Precautions / Restrictions Precautions Precautions: Back Precaution Comments: recalled 1/3 precauations Required Braces or Orthoses: Spinal Brace Spinal Brace: Lumbar corset;Applied in sitting position      Mobility  Bed Mobility Overal bed mobility: Needs Assistance Bed Mobility: Sit to Sidelying Rolling: Mod assist   Supine to sit: Max assist   Sit to sidelying: Mod assist General bed mobility comments: cues and assist for technique and assist for legs onto bed  Transfers Overall transfer level: Needs assistance Equipment used: Rolling walker (2 wheeled) Transfers: Sit to/from Stand Sit to Stand: Max assist Stand pivot transfers: Mod assist;From elevated surface       General transfer comment: lifting assist x 3 from chair limited and sitting back down due to c/o burning pain; max cues for hand placement and lifting help  Ambulation/Gait Ambulation/Gait assistance: Mod assist Gait Distance (Feet): 13 Feet Assistive device: Rolling walker (2 wheeled) Gait Pattern/deviations: Decreased stride length;Step-to pattern;Shuffle;Wide base of support     General Gait Details: flexed knees throughout and difficulty maintaining COG over BOS assist to keep from pushing walker too  far out in front.   Stairs            Wheelchair Mobility    Modified Rankin (Stroke Patients Only)       Balance Overall balance assessment: Needs assistance Sitting-balance support: Feet supported Sitting balance-Leahy Scale: Poor Sitting balance - Comments:  leaning back sitting at edge of chair despite cues for leaning forward and max cues for hand placement on chair   Standing balance support: Bilateral upper extremity supported Standing balance-Leahy Scale: Poor Standing balance comment: UE support and assist needed                             Pertinent Vitals/Pain Pain Assessment: Faces Pain Score: 7  Faces Pain Scale: Hurts whole lot Pain Location: back Pain Descriptors / Indicators: Operative site guarding;Grimacing Pain Intervention(s): Repositioned;Monitored during session    Home Living Family/patient expects to be discharged to:: Skilled nursing facility Living Arrangements: Spouse/significant other Available Help at Discharge: Family;Available 24 hours/day Type of Home: House Home Access: Level entry     Home Layout: One level Home Equipment: Cane - single point;Adaptive equipment Additional Comments: retired Teacher, music Level of Independence: Independent         Comments: has a Doctor, hospital named "[facility]"     Hand Dominance   Dominant Hand: Right    Extremity/Trunk Assessment   Upper Extremity Assessment Upper Extremity Assessment: Defer to OT evaluation    Lower Extremity Assessment Lower Extremity Assessment: RLE deficits/detail;LLE deficits/detail RLE Deficits / Details: AROM WFL, strength hip flexion 3/5, knee extension 4/5 LLE Deficits / Details: AROM  WFL, strength hip flexion 3/5, knee extension 4/5    Cervical / Trunk Assessment Cervical / Trunk Assessment: Other exceptions Cervical / Trunk Exceptions: (s/p surg)  Communication   Communication: No difficulties  Cognition Arousal/Alertness:  Awake/alert Behavior During Therapy: WFL for tasks assessed/performed Overall Cognitive Status: Impaired/Different from baseline Area of Impairment: Orientation;Following commands;Safety/judgement                 Orientation Level: Disoriented to;Time;Situation;Place     Following Commands: Follows one step commands with increased time Safety/Judgement: Decreased awareness of safety;Decreased awareness of deficits     General Comments: Thought he was in [place] and forgot about having back surgery yesterday      General Comments General comments (skin integrity, edema, etc.): dressings dry and intact x4. wife educated on clean linen and avoiding washing directly on site.     Exercises     Assessment/Plan    PT Assessment Patient needs continued PT services  PT Problem List Decreased strength;Decreased mobility;Decreased activity tolerance;Decreased balance;Decreased knowledge of precautions;Decreased safety awareness;Decreased cognition;Decreased knowledge of use of DME       PT Treatment Interventions DME instruction;Therapeutic activities;Gait training;Therapeutic exercise;Patient/family education;Balance training;Functional mobility training    PT Goals (Current goals can be found in the Care Plan section)  Acute Rehab PT Goals Patient Stated Goal: agrees to rehab PT Goal Formulation: With patient/family Time For Goal Achievement: 06/09/18 Potential to Achieve Goals: Good    Frequency Min 5X/week   Barriers to discharge        Co-evaluation               AM-PAC PT "6 Clicks" Daily Activity  Outcome Measure Difficulty turning over in bed (including adjusting bedclothes, sheets and blankets)?: Unable Difficulty moving from lying on back to sitting on the side of the bed? : Unable Difficulty sitting down on and standing up from a chair with arms (e.g., wheelchair, bedside commode, etc,.)?: Unable Help needed moving to and from a bed to chair (including a  wheelchair)?: A Lot Help needed walking in hospital room?: A Lot Help needed climbing 3-5 steps with a railing? : Total 6 Click Score: 8    End of Session Equipment Utilized During Treatment: Gait belt;Back brace Activity Tolerance: Patient limited by pain Patient left: in bed;with call bell/phone within reach;with family/visitor present   PT Visit Diagnosis: Other abnormalities of gait and mobility (R26.89);Difficulty in walking, not elsewhere classified (R26.2);Pain Pain - part of body: (back)    Time: 4920-1007 PT Time Calculation (min) (ACUTE ONLY): 21 min   Charges:   PT Evaluation $PT Eval Moderate Complexity: [place], [place] [phone] 05/26/2018   Reginia Naas 05/26/2018, 12:18 PM

## 2018-05-26 NOTE — Clinical Social Work Note (Signed)
Clinical Social Work Assessment  Patient Details  Name: Mark Warren. MRN: [id number] Date of Birth: 1946-09-03  Date of referral:  05/26/18               Reason for consult:  Facility Placement, Discharge Planning                Permission sought to share information with:  Facility Sport and exercise psychologist, Family Supports Permission granted to share information::  Yes, Verbal Permission Granted  Name::     Myrtice Product/process development scientist::  [facility]   Relationship::  wife  Contact Information:  [phone]  Housing/Transportation Living arrangements for the past 2 months:  Single Family Home Source of Information:  Patient, Spouse Patient Interpreter Needed:  None Criminal Activity/Legal Involvement Pertinent to Current Situation/Hospitalization:  No - Comment as needed Significant Relationships:  Spouse, Pets Lives with:  Spouse, Pets Do you feel safe going back to the place where you live?  Yes Need for family participation in patient care:  Yes (Comment)  Care giving concerns:  Pt lives at home with wife and pet, both pt and his wife feel as though even though he has good support at home that pt wife is unable to provide significant physical assist.    Facilities manager / plan:  CSW spoke with pt and pt wife at bedside, pt having some difficulty with speech (states his mouth is dry and feels funny). Pt animated and pleasant, as is pt wife. Pt and pt wife live in [place] with their small dog. Pt states that he is still having some pain and although he has support from wife, pt wife states that she is unable to provide significant physical assistance currently.   CSW explained role and different options for rehab (home health vs. SNF), and pt and pt wife have chosen for SNF level rehab at the recommendation of therapies and their own understanding of needs and limitations. Pt and pt wife prefer [facility] or [facility].   Employment status:  Retired Radiation protection practitioner:  [employer] PT Recommendations:  [place], [place] / Referral to community resources:  [place]  Patient/Family's Response to care:  Pt and pt wife amenable to [place] visit, understanding of role and recommendations, pt and pt wife agreeable to referrals and deciding between rehab placements.   Patient/Family's Understanding of and Emotional Response to Diagnosis, Current Treatment, and Prognosis:  Pt and pt wife state understanding of diagnosis, current treatment and prognosis. Pt and pt wife positive about pt recovery and rehab so he can return home and be with his dog who he enjoys immensely. Pt and pt wife understanding of pt needs and realistic regarding rehab and pt future progression.   Emotional Assessment Appearance:  Appears stated age Attitude/Demeanor/Rapport:  Charismatic, Engaged, Gracious Affect (typically observed):  Accepting, Adaptable, Appropriate, Pleasant Orientation:  Oriented to Self, Oriented to Place, Oriented to  Time, Fluctuating Orientation (Suspected and/or reported Sundowners) Alcohol / Substance use:  Not Applicable Psych involvement (Current and /or in the community):  No (Comment)  Discharge Needs  Concerns to be addressed:  Care Coordination Readmission within the last 30 days:  No Current discharge risk:  Dependent with Mobility, Physical Impairment Barriers to Discharge:  Ship broker, Continued Medical Work up   [employer], [place] 05/26/2018, 4:02 PM

## 2018-05-26 NOTE — Progress Notes (Signed)
Patient ID: Mark Warren., male   DOB: 07/21/1946, 71 y.o.   MRN: [id number] Note for clarification of plan... Leave foley indwelling x5 days with outpatient urology f/u ([facility] or [facility]). Monitor foreskin swelling; using cold pack prn (swelling has decreased significantly over last 8hrs, no evidence of circulation compromise). Mobilize with PT/OT in brace. If confusion does not improve significantly will likely need SNF for rehab.

## 2018-05-26 NOTE — Progress Notes (Signed)
Aaron Edelman, RN with Dr. Vertell Limber spoke with Dr. Alyson Ingles, Urologist, who advised to leave foley in x 5 days.

## 2018-05-26 NOTE — Progress Notes (Addendum)
Subjective: Patient reports "My back hurts some, but that's nothing new"  Objective: Vital signs in last 24 hours: Temp:  [97.3 F (36.3 C)-100.2 F (37.9 C)] 100.2 F (37.9 C) (08/08 2300) Pulse Rate:  [69-86] 69 (08/08 1526) Resp:  [15-20] 17 (08/08 1453) BP: (133-166)/(78-96) 136/95 (08/08 2300) SpO2:  [93 %-99 %] 93 % (08/08 2300)  Intake/Output from previous day: 08/08 0701 - 08/09 0700 In: 4814 [I.V.:3606.6; IV Piggyback:1207.4] Out: 2305 [Urine:2205; Blood:100] Intake/Output this shift: No intake/output data recorded.  Awake, conversant, but confused. Wife present, noting confusion overnight. MAEW with good strength. Right side and lumbar incisions without erythema, swelling, or drainage beneath honeycomb and Dermabond. Foley draining clear yellow at present, blood-tinged in bad. Foley was replaced last night and required irrigation ~3am.  Foreskin swelling reportedly made for a difficult cath. Some swelling remains.   Lab Results: No results for input(s): WBC, HGB, HCT, PLT in the last 72 hours. BMET No results for input(s): NA, K, CL, CO2, GLUCOSE, BUN, CREATININE, CALCIUM in the last 72 hours.  Studies/Results: Dg Lumbar Spine 2-3 Views  Result Date: 05/25/2018 CLINICAL DATA:  72 year old male for lumbar fusion. Initial encounter. EXAM: DG C-ARM 61-120 MIN; LUMBAR SPINE - 2-3 VIEW Fluoroscopic time: 6 minutes and 9 seconds. COMPARISON:  Outside plain film examination 03/22/2018 and outside MR 12/12/2017. FINDINGS: There is a transitional vertebra. If this is labeled S1 with a rudimentary disc at the S1-2 level, patient has undergone fusion L3-S1 with interbody spacer L3-4, L4-5 and L5-S1 level. Bilateral pedicle screws and posterior connecting bar. The L5 pedicle screws are difficult to confirm as in the pedicle on lateral view which may be related to slight tilt. This can be assessed on follow-up. IMPRESSION: If transitional vertebra is labeled S1 than patient has undergone  fusion L3-S1 as detailed above. Correlation with previous level assignment recommended. Electronically Signed   By: Genia Del M.D.   On: 05/25/2018 13:22   Dg C-arm 1-60 Min  Result Date: 05/25/2018 CLINICAL DATA:  72 year old male for lumbar fusion. Initial encounter. EXAM: DG C-ARM 61-120 MIN; LUMBAR SPINE - 2-3 VIEW Fluoroscopic time: 6 minutes and 9 seconds. COMPARISON:  Outside plain film examination 03/22/2018 and outside MR 12/12/2017. FINDINGS: There is a transitional vertebra. If this is labeled S1 with a rudimentary disc at the S1-2 level, patient has undergone fusion L3-S1 with interbody spacer L3-4, L4-5 and L5-S1 level. Bilateral pedicle screws and posterior connecting bar. The L5 pedicle screws are difficult to confirm as in the pedicle on lateral view which may be related to slight tilt. This can be assessed on follow-up. IMPRESSION: If transitional vertebra is labeled S1 than patient has undergone fusion L3-S1 as detailed above. Correlation with previous level assignment recommended. Electronically Signed   By: Genia Del M.D.   On: 05/25/2018 13:22   Dg C-arm 1-60 Min  Result Date: 05/25/2018 CLINICAL DATA:  72 year old male for lumbar fusion. Initial encounter. EXAM: DG C-ARM 61-120 MIN; LUMBAR SPINE - 2-3 VIEW Fluoroscopic time: 6 minutes and 9 seconds. COMPARISON:  Outside plain film examination 03/22/2018 and outside MR 12/12/2017. FINDINGS: There is a transitional vertebra. If this is labeled S1 with a rudimentary disc at the S1-2 level, patient has undergone fusion L3-S1 with interbody spacer L3-4, L4-5 and L5-S1 level. Bilateral pedicle screws and posterior connecting bar. The L5 pedicle screws are difficult to confirm as in the pedicle on lateral view which may be related to slight tilt. This can be assessed on follow-up. IMPRESSION:  If transitional vertebra is labeled S1 than patient has undergone fusion L3-S1 as detailed above. Correlation with previous level assignment  recommended. Electronically Signed   By: Genia Del M.D.   On: 05/25/2018 13:22   Dg C-arm 1-60 Min  Result Date: 05/25/2018 CLINICAL DATA:  72 year old male for lumbar fusion. Initial encounter. EXAM: DG C-ARM 61-120 MIN; LUMBAR SPINE - 2-3 VIEW Fluoroscopic time: 6 minutes and 9 seconds. COMPARISON:  Outside plain film examination 03/22/2018 and outside MR 12/12/2017. FINDINGS: There is a transitional vertebra. If this is labeled S1 with a rudimentary disc at the S1-2 level, patient has undergone fusion L3-S1 with interbody spacer L3-4, L4-5 and L5-S1 level. Bilateral pedicle screws and posterior connecting bar. The L5 pedicle screws are difficult to confirm as in the pedicle on lateral view which may be related to slight tilt. This can be assessed on follow-up. IMPRESSION: If transitional vertebra is labeled S1 than patient has undergone fusion L3-S1 as detailed above. Correlation with previous level assignment recommended. Electronically Signed   By: Genia Del M.D.   On: 05/25/2018 13:22    Assessment/Plan:   LOS: 1 day  Mobilize in LSO and advance diet as tolerated.    Verdis Prime 05/26/2018, 7:21 AM   Urologist to evaluate patient today.

## 2018-05-26 NOTE — NC FL2 (Signed)
[place] LEVEL OF CARE SCREENING TOOL     IDENTIFICATION  Patient Name: Mark Warren. Birthdate: [birth year] Sex: male Admission Date (Current Location): 05/25/2018  [facility] and [place] Number:  Herbalist and Address:  [facility] [place] [place], [place] [address] [place], [place] [address]      Provider Number: [id number]  Attending Physician Name and Address:  Erline Levine, MD  Relative Name and Phone Number:  Lemon Sternberg, wife, [phone]    Current Level of Care: Hospital Recommended Level of Care: [place] Prior Approval Number:    Date Approved/Denied:   PASRR Number:    Discharge Plan: SNF    Current Diagnoses: Patient Active Problem List   Diagnosis Date Noted  . Spondylolisthesis of lumbar region 05/25/2018    Orientation RESPIRATION BLADDER Height & Weight     Self, Time, Situation, Place  Normal Indwelling catheter, Incontinent Weight: 216 lb 0.8 oz (98 kg) Height:  5\' 10"  (177.8 cm)  BEHAVIORAL SYMPTOMS/MOOD NEUROLOGICAL BOWEL NUTRITION STATUS    (some memory loss) Continent Diet(see discharge summary)  AMBULATORY STATUS COMMUNICATION OF NEEDS Skin   Extensive Assist Verbally Surgical wounds                       Personal Care Assistance Level of Assistance  Bathing, Feeding, Dressing Bathing Assistance: Maximum assistance Feeding assistance: Independent Dressing Assistance: Maximum assistance     Functional Limitations Info  Sight, Hearing, Speech Sight Info: Adequate Hearing Info: Adequate Speech Info: Adequate    SPECIAL CARE FACTORS FREQUENCY  PT (By licensed PT), OT (By licensed OT)     PT Frequency: 5x week OT Frequency: 5x week            Contractures Contractures Info: Not present    Additional Factors Info  Allergies, Code Status, Psychotropic Code Status Info: Full Code Allergies Info: Oxycodone Psychotropic Info: pregabalin (LYRICA) capsule 150 mg daily PO          Current Medications (05/26/2018):  This is the current hospital active medication list Current Facility-Administered Medications  Medication Dose Route Frequency Provider Last Rate Last Dose  . 0.9 %  sodium chloride infusion  250 mL Intravenous Continuous Erline Levine, MD   Stopped at 05/25/18 1552  . acetaminophen (TYLENOL) tablet 650 mg  650 mg Oral Q4H PRN Erline Levine, MD       Or  . acetaminophen (TYLENOL) suppository 650 mg  650 mg Rectal Q4H PRN Erline Levine, MD      . alum & mag hydroxide-simeth (MAALOX/MYLANTA) 200-200-20 MG/5ML suspension 30 mL  30 mL Oral Q6H PRN Erline Levine, MD      . bisacodyl (DULCOLAX) suppository 10 mg  10 mg Rectal Daily PRN Erline Levine, MD      . docusate sodium (COLACE) capsule 100 mg  100 mg Oral BID Erline Levine, MD   100 mg at 05/26/18 0934  . doxazosin (CARDURA) tablet 4 mg  4 mg Oral Daily Erline Levine, MD   4 mg at 05/26/18 0934  . famotidine (PEPCID) tablet 20 mg  20 mg Oral BID Erline Levine, MD   20 mg at 05/26/18 1223  . finasteride (PROSCAR) tablet 5 mg  5 mg Oral Daily Erline Levine, MD   5 mg at 05/26/18 0934  . HYDROcodone-acetaminophen (NORCO) 10-325 MG per tablet 2 tablet  2 tablet Oral Q4H PRN Erline Levine, MD   2 tablet at 05/26/18 0934  .  HYDROcodone-acetaminophen (NORCO/VICODIN) 5-325 MG per tablet 1 tablet  1 tablet Oral Q4H PRN Erline Levine, MD      . HYDROmorphone (DILAUDID) injection 1 mg  1 mg Intravenous Q2H PRN Erline Levine, MD   1 mg at 05/26/18 0321  . menthol-cetylpyridinium (CEPACOL) lozenge 3 mg  1 lozenge Oral PRN Erline Levine, MD       Or  . phenol [facility]) mouth spray 1 spray  1 spray Mouth/Throat PRN Erline Levine, MD      . methocarbamol (ROBAXIN) tablet 500 mg  500 mg Oral Q6H PRN Erline Levine, MD   500 mg at 05/25/18 2133   Or  . methocarbamol (ROBAXIN) 500 mg in dextrose 5 % 50 mL IVPB  500 mg Intravenous Q6H PRN Erline Levine, MD      . multivitamin with minerals tablet 1 tablet  1 tablet  Oral Daily Erline Levine, MD   1 tablet at 05/26/18 [phone]  . ondansetron (ZOFRAN) tablet 4 mg  4 mg Oral Q6H PRN Erline Levine, MD       Or  . ondansetron [facility]) injection 4 mg  4 mg Intravenous Q6H PRN Erline Levine, MD      . pantoprazole (PROTONIX) EC tablet 40 mg  40 mg Oral Daily PRN Erline Levine, MD      . polyethylene glycol (MIRALAX / GLYCOLAX) packet 17 g  17 g Oral Daily PRN Erline Levine, MD      . pregabalin (LYRICA) capsule 150 mg  150 mg Oral Daily Erline Levine, MD   150 mg at 05/26/18 0934  . ramipril (ALTACE) capsule 2.5 mg  2.5 mg Oral Daily Erline Levine, MD   2.5 mg at 05/26/18 1223  . sodium chloride flush (NS) 0.9 % injection 3 mL  3 mL Intravenous Q12H Erline Levine, MD   3 mL at 05/26/18 0936  . sodium chloride flush (NS) 0.9 % injection 3 mL  3 mL Intravenous PRN Erline Levine, MD      . sodium phosphate (FLEET) 7-19 GM/118ML enema 1 enema  1 enema Rectal Once PRN Erline Levine, MD      . zolpidem [facility]) tablet 5 mg  5 mg Oral QHS PRN Erline Levine, MD         Discharge Medications: Please see discharge summary for a list of discharge medications.  Relevant Imaging Results:  Relevant Lab Results:   Additional Information [id number] [place], [place]

## 2018-05-27 NOTE — Progress Notes (Signed)
Occupational Therapy Treatment Patient Details Name: Mark Warren. MRN: [id number] DOB: 1946-02-27 Today's Date: 05/27/2018    History of present illness 72 yo male s/p XLIF L2-3 L3-4 L4-5 PMH: DOE HTN trigeminal neuralgia R shoulder surg    OT comments  Pt requires incr time and asking the same questions over and over during session. "why does my back hurt?" " what do you think makes that happen?" "will it get better? When?" pt provided answers and unable to recall information. Pt seems very shocked to learn of cognitive changes.    Follow Up Recommendations  SNF    Equipment Recommendations  3 in 1 bedside commode;Other (comment)    Recommendations for Other Services      Precautions / Restrictions Precautions Precautions: Back Precaution Comments: no recall of precautions. poor recall after teach back Required Braces or Orthoses: Spinal Brace Spinal Brace: Lumbar corset       Mobility Bed Mobility Overal bed mobility: Needs Assistance Bed Mobility: Rolling;Sidelying to Sit Rolling: Mod assist Sidelying to sit: Mod assist;+2 for safety/equipment Supine to sit: Max assist   Sit to sidelying: General bed mobility comments: cues for technique, assist to help bring to midline upright.  Transfers Overall transfer level: Needs assistance Equipment used: Rolling walker (2 wheeled) Transfers: Sit to/from Stand Sit to Stand: Mod assist         General transfer comment: pt with bil ue on RW with cues and hand over hand to power up    Balance                                           ADL either performed or assessed with clinical judgement   ADL Overall ADL's : Needs assistance/impaired Eating/Feeding: Set up;Sitting Eating/Feeding Details (indicate cue type and reason): pt drinking soda in chair              Upper Body Dressing : Total assistance Upper Body Dressing Details (indicate cue type and reason): pt requires step by step (A) to  don brace. pt unable to complete task. pt with poseterior lob at [employer] to (A) Lower Body Dressing: Total assistance               Functional mobility during ADLs: +2 for physical assistance;+2 for safety/equipment;Moderate assistance       Vision       Perception     Praxis      Cognition Arousal/Alertness: Awake/alert Behavior During Therapy: WFL for tasks assessed/performed Overall Cognitive Status: Impaired/Different from baseline Area of Impairment: Orientation;Attention;Memory;Following commands;Safety/judgement;Awareness;Problem solving                 Orientation Level: Disoriented to;Place;Time Current Attention Level: Sustained Memory: Decreased short-term memory;Decreased recall of precautions Following Commands: Follows one step commands inconsistently;Follows multi-step commands with increased time Safety/Judgement: Decreased awareness of deficits;Decreased awareness of safety Awareness: Intellectual Problem Solving: Slow processing General Comments: pt unable to recall hospital name but recognized that the name provided by patient was incorrect. pt requesting to be called Mark Warren on eval and now requesting Mark Warren. pt very literal response to all questions. pt unable to chain informaton and needs 1 step commands with tactile cues at times        Exercises     Shoulder Instructions       General Comments pt with progressive weakness in bil LE and  starting to flex kness. pt requires cues for upright posture    Pertinent Vitals/ Pain       Pain Assessment: Faces Faces Pain Scale: Hurts whole lot Pain Location: back Pain Descriptors / Indicators: Operative site guarding Pain Intervention(s): Monitored during session  Home Living                                          Prior Functioning/Environment              Frequency  Min 3X/week        Progress Toward Goals  OT Goals(current goals can now be found in the  care plan section)  Progress towards OT goals: Progressing toward goals  Acute Rehab OT Goals Patient Stated Goal: agrees to rehab OT Goal Formulation: With patient/family Time For Goal Achievement: 06/09/18 Potential to Achieve Goals: Good ADL Goals Pt Will Perform Grooming: with supervision;standing Pt Will Transfer to Toilet: with min assist;ambulating;bedside commode Pt Will Perform Tub/Shower Transfer: Shower transfer;ambulating;shower seat;rolling walker;with min assist Additional ADL Goal #1: Pt will verbalize back precautions 100 % without cues Additional ADL Goal #2: Pt will don doff back brace mod I as precursor to adls.  Plan Discharge plan remains appropriate    Co-evaluation                 AM-PAC PT "6 Clicks" Daily Activity     Outcome Measure   Help from another person eating meals?: A Little Help from another person taking care of personal grooming?: A Little Help from another person toileting, which includes using toliet, bedpan, or urinal?: A Lot Help from another person bathing (including washing, rinsing, drying)?: A Lot Help from another person to put on and taking off regular upper body clothing?: A Lot Help from another person to put on and taking off regular lower body clothing?: A Lot 6 Click Score: 14    End of Session Equipment Utilized During Treatment: Gait belt;Rolling walker;Back brace  OT Visit Diagnosis: Unsteadiness on feet (R26.81);Muscle weakness (generalized) (M62.81);Pain   Activity Tolerance Patient tolerated treatment well   Patient Left in chair;with call bell/phone within reach   Nurse Communication Mobility status;Precautions        Time: 1583-0940 OT Time Calculation (min): 25 min  Charges: OT General Charges $OT Visit: 1 Visit OT Treatments $Therapeutic Activity: 8-22 mins   Jeri Modena   OTR/L Pager: [phone] Office: [phone] .    Parke Poisson B 05/27/2018, 2:04 PM

## 2018-05-27 NOTE — Progress Notes (Signed)
Physical Therapy Treatment Patient Details Name: Mark Warren. MRN: [id number] DOB: 1945/12/04 Today's Date: 05/27/2018    History of Present Illness 72 yo male s/p XLIF L2-3 L3-4 L4-5 PMH: DOE HTN trigeminal neuralgia R shoulder surg     PT Comments    Pt still rather confused and disorient, but better than yesterday.  Emphasis on bed mobility, transfers and gait stability/stamina.   Follow Up Recommendations  SNF;Supervision/Assistance - 24 hour     Equipment Recommendations  Rolling walker with 5" wheels    Recommendations for Other Services       Precautions / Restrictions Precautions Precautions: Back Precaution Comments: no recall of precautions. poor recall after teach back Required Braces or Orthoses: Spinal Brace Spinal Brace: Lumbar corset    Mobility  Bed Mobility Overal bed mobility: Needs Assistance Bed Mobility: Rolling;Sidelying to Sit Rolling: Mod assist Sidelying to sit: Mod assist;Max assist Supine to sit: Max assist     General bed mobility comments: cues for technique, assist to help bring to midline upright.  Transfers Overall transfer level: Needs assistance Equipment used: Rolling walker (2 wheeled) Transfers: Sit to/from Stand Sit to Stand: Mod assist Stand pivot transfers: Mod assist;From elevated surface       General transfer comment: pt with bil UE on RW with cues and hand over hand to power up  Ambulation/Gait Ambulation/Gait assistance: Mod assist;+2 safety/equipment Gait Distance (Feet): 40 Feet Assistive device: Rolling walker (2 wheeled) Gait Pattern/deviations: Step-through pattern;Decreased stride length Gait velocity: slower Gait velocity interpretation: <1.31 ft/sec, indicative of household ambulator General Gait Details: mildly unsteady overall.  Consistent cues to stay upright and in full knee extention.   Stairs             Wheelchair Mobility    Modified Rankin (Stroke Patients Only)        Balance Overall balance assessment: Needs assistance Sitting-balance support: Feet supported Sitting balance-Leahy Scale: Poor Sitting balance - Comments: still with tendency to list posteriorly   Standing balance support: Bilateral upper extremity supported Standing balance-Leahy Scale: Poor Standing balance comment: UE support and assist needed                            Cognition Arousal/Alertness: Awake/alert Behavior During Therapy: WFL for tasks assessed/performed Overall Cognitive Status: Impaired/Different from baseline Area of Impairment: Orientation;Attention;Memory;Following commands;Safety/judgement;Awareness;Problem solving                 Orientation Level: Disoriented to;Place;Time Current Attention Level: Sustained Memory: Decreased short-term memory;Decreased recall of precautions Following Commands: Follows one step commands inconsistently;Follows multi-step commands with increased time Safety/Judgement: Decreased awareness of deficits;Decreased awareness of safety Awareness: Intellectual Problem Solving: Slow processing General Comments: pt unable to recall hospital name but recognized that the name provided by patient was incorrect. pt requesting to be called Mark Warren on eval and now requesting Mark Warren. pt very literal response to all questions. pt unable to chain informaton and needs 1 step commands with tactile cues at times      Exercises      General Comments General comments (skin integrity, edema, etc.): pt with progressive weakness in bil LE and starting to flex kness. pt requires cues for upright posture      Pertinent Vitals/Pain Pain Assessment: Faces Faces Pain Scale: Hurts whole lot Pain Location: back Pain Descriptors / Indicators: Operative site guarding Pain Intervention(s): Monitored during session    Home Living  Prior Function            PT Goals (current goals can now be found in the care  plan section) Acute Rehab PT Goals Patient Stated Goal: agrees to rehab PT Goal Formulation: With patient/family Time For Goal Achievement: 06/09/18 Potential to Achieve Goals: Good Progress towards PT goals: Progressing toward goals    Frequency    Min 5X/week      PT Plan Current plan remains appropriate    Co-evaluation              AM-PAC PT "6 Clicks" Daily Activity  Outcome Measure  Difficulty turning over in bed (including adjusting bedclothes, sheets and blankets)?: Unable Difficulty moving from lying on back to sitting on the side of the bed? : Unable Difficulty sitting down on and standing up from a chair with arms (e.g., wheelchair, bedside commode, etc,.)?: Unable Help needed moving to and from a bed to chair (including a wheelchair)?: A Lot Help needed walking in hospital room?: A Lot Help needed climbing 3-5 steps with a railing? : Total 6 Click Score: 8    End of Session Equipment Utilized During Treatment: Gait belt;Back brace Activity Tolerance: Patient limited by pain Patient left: in bed;with call bell/phone within reach;with family/visitor present   PT Visit Diagnosis: Other abnormalities of gait and mobility (R26.89);Difficulty in walking, not elsewhere classified (R26.2);Pain Pain - part of body: (back)     Time: 8144-8185 PT Time Calculation (min) (ACUTE ONLY): 25 min  Charges:  $Gait Training: 8-22 mins                     05/27/2018  [name], PT [phone]  (pager)   [name] [name] 05/27/2018, 2:17 PM

## 2018-05-27 NOTE — Progress Notes (Signed)
  NEUROSURGERY PROGRESS NOTE   No issues overnight. Reports back and right hip pain.  EXAM:  BP (!) 155/92 (BP Location: Left Arm)   Pulse 87   Temp 97.8 F (36.6 C) (Oral)   Resp 18   Ht 5\' 10"  (1.778 m)   Wt 98 kg   SpO2 94%   BMI 31.00 kg/m   Awake, alert, oriented  Speech fluent, appropriate  CN grossly intact  Good strength BLE Wounds c/d/i  IMPRESSION:  73 y.o. male POD# 2 s/p L2-L5 lateral interbody fusion/perc screws. Hematuria requiring foley.  PLAN: - Will plan on keeping foley with outpatient urology f/u - SNF placement - Cont to mobilize with therapy

## 2018-05-28 MED ORDER — HEPARIN SODIUM (PORCINE) 5000 UNIT/ML IJ SOLN
5000.0000 [IU] | Freq: Three times a day (TID) | INTRAMUSCULAR | Status: DC
  Administered 2018-05-28 – 2018-05-29 (×3): 5000 [IU] via SUBCUTANEOUS
  Filled 2018-05-28 (×3): qty 1

## 2018-05-28 MED ORDER — SENNA 8.6 MG PO TABS
1.0000 | ORAL_TABLET | Freq: Every day | ORAL | Status: DC
  Administered 2018-05-28 – 2018-05-29 (×2): 8.6 mg via ORAL
  Filled 2018-05-28 (×2): qty 1

## 2018-05-28 NOTE — Progress Notes (Addendum)
  NEUROSURGERY PROGRESS NOTE   No issues overnight. Doing well this am, does report some back soreness. Able to ambulate with PT yesterday with assistance.  EXAM:  BP (!) 159/99 (BP Location: Left Arm)   Pulse 80   Temp 98.6 F (37 C) (Oral)   Resp 16   Ht 5\' 10"  (1.778 m)   Wt 98 kg   SpO2 90%   BMI 31.00 kg/m   Awake, alert, oriented  Speech fluent, appropriate  CN grossly intact  Good strength BLE Wounds c/d/i  IMPRESSION:  72 y.o. male POD# 3 s/p L2-L5 lateral interbody fusion/perc screws. Hematuria requiring foley.  PLAN: - Cont to mobilize with therapy - SNF placement - Cont Foley - Will start prophylactic subQ heparin

## 2018-05-28 NOTE — Plan of Care (Signed)
  Problem: Pain Management: Goal: Pain level will decrease Outcome: Progressing   

## 2018-05-29 ENCOUNTER — Encounter (HOSPITAL_COMMUNITY): Payer: Self-pay | Admitting: Neurosurgery

## 2018-05-29 MED ORDER — HYDROCODONE-ACETAMINOPHEN 5-325 MG PO TABS: 1.0000 | ORAL_TABLET | ORAL | 0 refills | Status: AC | PRN

## 2018-05-29 MED ORDER — METHOCARBAMOL 500 MG PO TABS: 500.0000 mg | ORAL_TABLET | Freq: Four times a day (QID) | ORAL | 2 refills | Status: AC | PRN

## 2018-05-29 NOTE — Progress Notes (Signed)
Physical Therapy Treatment Patient Details Name: Mark Warren. MRN: [id number] DOB: [birth year] Today's Date: 05/29/2018    History of Present Illness 72 yo male s/p XLIF L2-3 L3-4 L4-5 PMH: DOE HTN trigeminal neuralgia R shoulder surg     PT Comments    Pt noticeably improved.  Pt is able to ambulate with either knee sagging into flexion.    Follow Up Recommendations  SNF;Supervision/Assistance - 24 hour     Equipment Recommendations  Rolling walker with 5" wheels    Recommendations for Other Services       Precautions / Restrictions Precautions Precautions: Back Required Braces or Orthoses: Spinal Brace Spinal Brace: Lumbar corset Restrictions Weight Bearing Restrictions: No    Mobility  Bed Mobility Overal bed mobility: Needs Assistance             General bed mobility comments: OOB on arrival  Transfers Overall transfer level: Needs assistance Equipment used: Rolling walker (2 wheeled) Transfers: Sit to/from Stand Sit to Stand: Mod assist         General transfer comment: cues for hand placement and assist to come forward and up.  Ambulation/Gait Ambulation/Gait assistance: Mod assist Gait Distance (Feet): 140 Feet Assistive device: Rolling walker (2 wheeled) Gait Pattern/deviations: Step-through pattern;Decreased stride length Gait velocity: slower Gait velocity interpretation: <1.8 ft/sec, indicate of risk for recurrent falls General Gait Details: still mildly unsteady, but without the slow sag of his knees bilaterally   Stairs             Wheelchair Mobility    Modified Rankin (Stroke Patients Only)       Balance Overall balance assessment: Needs assistance Sitting-balance support: Feet supported Sitting balance-Leahy Scale: Fair       Standing balance-Leahy Scale: Poor                              Cognition Arousal/Alertness: Awake/alert Behavior During Therapy: WFL for tasks assessed/performed Overall  Cognitive Status: Impaired/Different from baseline(unable to focus well on task.)                                        Exercises      General Comments General comments (skin integrity, edema, etc.): Reinforced back care/prec,  with pt and wife.  Help pt /wife determine whether to transfer by ambulance vs self transport      Pertinent Vitals/Pain Pain Assessment: Faces Faces Pain Scale: Hurts whole lot Pain Location: back Pain Descriptors / Indicators: Operative site guarding Pain Intervention(s): Monitored during session;Premedicated before session    Home Living                      Prior Function            PT Goals (current goals can now be found in the care plan section) Acute Rehab PT Goals Patient Stated Goal: agrees to rehab PT Goal Formulation: With patient/family Time For Goal Achievement: 06/09/18 Potential to Achieve Goals: Good Progress towards PT goals: Progressing toward goals    Frequency    Min 5X/week      PT Plan Current plan remains appropriate    Co-evaluation              AM-PAC PT "6 Clicks" Daily Activity  Outcome Measure  Difficulty turning over in bed (including adjusting bedclothes,  sheets and blankets)?: Unable Difficulty moving from lying on back to sitting on the side of the bed? : Unable Difficulty sitting down on and standing up from a chair with arms (e.g., wheelchair, bedside commode, etc,.)?: Unable Help needed moving to and from a bed to chair (including a wheelchair)?: A Little Help needed walking in hospital room?: A Lot Help needed climbing 3-5 steps with a railing? : Total 6 Click Score: 9    End of Session Equipment Utilized During Treatment: Back brace Activity Tolerance: Patient limited by pain Patient left: in bed;with call bell/phone within reach;with family/visitor present Nurse Communication: Mobility status PT Visit Diagnosis: Other abnormalities of gait and mobility  (R26.89);Difficulty in walking, not elsewhere classified (R26.2);Pain Pain - part of body: (back)     Time: 0865-7846 PT Time Calculation (min) (ACUTE ONLY): 27 min  Charges:  $Gait Training: 8-22 mins $Therapeutic Activity: 8-22 mins                     05/29/2018  [name], PT [phone]  (pager)   [name] Kamri Gotsch 05/29/2018, 1:28 PM

## 2018-05-29 NOTE — Progress Notes (Signed)
Pt with d/c orders. Wife transferring pt to facility via own vehicle. Iv removed. Discharge paperwork reviewed and all questions answered. Order to keep foley in to follow up outpatient with urology. Report called to facility.

## 2018-05-29 NOTE — Social Work (Signed)
Clinical Social Worker facilitated patient discharge including contacting patient family and facility to confirm patient discharge plans.  Clinical information faxed to facility and family agreeable with plan.  Pt wife will provide transport in personal car to [facility].  RN to call [phone] then press 0 and ask operator to page Rehab 2 with report  prior to discharge.  Clinical Social Worker will sign off for now as social work intervention is no longer needed. Please consult [place] again if new need arises.  Alexander Mt, [place] Social Worker

## 2018-05-29 NOTE — Social Work (Addendum)
CSW spoke with pt at bedside, pt and pt wife have decided on [facility]. Will do one time hard fax to [facility] at [phone]. Pt and pt wife deciding between ambulance and personal car at discharge.   1:00pm- CSW faxed all information including discharge summary and script to number above. CSW called to ensure that clinicals were sent and received. Will support discharge after confirmation.   Alexander Mt, [place] Work [phone]

## 2018-05-29 NOTE — Discharge Summary (Signed)
Physician Discharge Summary  Patient ID: Mark Warren. MRN: [id number] DOB/AGE: 11-19-1945 72 y.o.  Admit date: 05/25/2018 Discharge date: 05/29/2018  Admission Diagnoses:  Spondylolisthesis of lumbar region  Discharge Diagnoses:  Same Active Problems:   Spondylolisthesis of lumbar region   Discharged Condition: Stable  Hospital Course:  Mark Warren. is a 72 y.o. male who was admitted for the below procedure. There were no post operative complications. At time of discharge, pain was well controlled, ambulating with Pt/OT, tolerating po, voiding normal. Ready for discharge to SNF. Continue foley.  Treatments: Surgery Right Lumbar two-three Lumbar three-four Lumbar four-five  Anterolateral lumbar interbody decompression/fusion with percutaneous pedicle screw fixation (Right) LUMBAR TWO-FIVE PERCUTANEOUS PEDICLE SCREW (N/A)  Discharge Exam: Blood pressure 116/81, pulse 87, temperature 98.6 F (37 C), temperature source Oral, resp. rate 18, height 5\' 10"  (1.778 m), weight 98 kg, SpO2 97 %. Awake, alert, oriented Speech fluent, appropriate CN grossly intact 5/5 BUE/BLE Wound c/d/i  Disposition: Discharge disposition: 03-Skilled Nursing Facility       Discharge Instructions    Call MD for:  difficulty breathing, headache or visual disturbances   Complete by:  As directed    Call MD for:  persistant dizziness or light-headedness   Complete by:  As directed    Call MD for:  redness, tenderness, or signs of infection (pain, swelling, redness, odor or green/yellow discharge around incision site)   Complete by:  As directed    Call MD for:  severe uncontrolled pain   Complete by:  As directed    Call MD for:  temperature >100.4   Complete by:  As directed    Diet general   Complete by:  As directed    Driving Restrictions   Complete by:  As directed    Do not drive until given clearance.   Increase activity slowly   Complete by:  As directed    Lifting  restrictions   Complete by:  As directed    Do not lift anything >10lbs. Avoid bending and twisting in awkward positions. Avoid bending at the back.   May shower / Bathe   Complete by:  As directed    In 24 hours. Okay to wash wound with warm soapy water. Avoid scrubbing the wound. Pat dry.   Remove dressing in 24 hours   Complete by:  As directed      Allergies as of 05/29/2018      Reactions   Oxycodone Nausea And Vomiting   GI Upset      Medication List    TAKE these medications   doxazosin 4 MG tablet Commonly known as:  CARDURA Take 4 mg by mouth daily.   finasteride 5 MG tablet Commonly known as:  PROSCAR Take 5 mg by mouth daily.   HYDROcodone-acetaminophen 5-325 MG tablet Commonly known as:  NORCO/VICODIN Take 1-2 tablets by mouth every 4 (four) hours as needed for moderate pain.   methocarbamol 500 MG tablet Commonly known as:  ROBAXIN Take 1 tablet (500 mg total) by mouth every 6 (six) hours as needed for muscle spasms.   multivitamin with minerals Tabs tablet Take 1 tablet by mouth daily.   pantoprazole 40 MG tablet Commonly known as:  PROTONIX Take 40 mg by mouth daily as needed (for acid reflux).   pregabalin 150 MG capsule Commonly known as:  LYRICA Take 150 mg by mouth daily.   ramipril 2.5 MG capsule Commonly known as:  ALTACE Take 2.5 mg by  mouth daily.        SignedTraci Sermon 05/29/2018, 8:54 AM

## 2018-05-29 NOTE — Clinical Social Work Placement (Signed)
   CLINICAL SOCIAL WORK PLACEMENT  NOTE Roman Sadie Haber RN to call [phone] then press 0 and ask operator to page Rehab 2   Date:  05/29/2018  Patient Details  Name: Mark Warren. MRN: [id number] Date of Birth: [birth year]  Clinical Social Work is seeking post-discharge placement for this patient at the [place] level of care (*CSW will initial, date and re-position this form in  chart as items are completed):  Yes   Patient/family provided with [place] Work Department's list of facilities offering this level of care within the geographic area requested by the patient (or if unable, by the patient's family).  Yes   Patient/family informed of their freedom to choose among providers that offer the needed level of care, that participate in Medicare, Medicaid or managed care program needed by the patient, have an available bed and are willing to accept the patient.  Yes   Patient/family informed of 's ownership interest in [facility] and [facility], as well as of the fact that they are under no obligation to receive care at these facilities.  PASRR submitted to EDS on       PASRR number received on       Existing PASRR number confirmed on       FL2 transmitted to all facilities in geographic area requested by pt/family on 05/26/18     FL2 transmitted to all facilities within larger geographic area on       Patient informed that his/her managed care company has contracts with or will negotiate with certain facilities, including the following:        Yes   Patient/family informed of bed offers received.  Patient chooses bed at       Physician recommends and patient chooses bed at      Patient to be transferred to   on  .  Patient to be transferred to facility by personal car     Patient family notified on   of transfer.  Name of family member notified:  pt wife Myrtice     PHYSICIAN       Additional Comment:     _______________________________________________ Alexander Mt, [place] 05/29/2018, 2:01 PM

## 2018-05-30 MED FILL — Heparin Sodium (Porcine) Inj 1000 Unit/ML: INTRAMUSCULAR | Qty: 30 | Status: AC

## 2018-05-30 MED FILL — Sodium Chloride IV Soln 0.9%: INTRAVENOUS | Qty: 1000 | Status: AC
